# Patient Record
Sex: Female | Born: 1980 | Race: White | Hispanic: No | Marital: Married | Smoking: Former smoker
Health system: Southern US, Community
[De-identification: ages and names within clinical notes are randomized; demographics above are authoritative.]

## PROBLEM LIST (undated history)

## (undated) ENCOUNTER — Inpatient Hospital Stay (HOSPITAL_COMMUNITY): Payer: Self-pay

## (undated) DIAGNOSIS — R87629 Unspecified abnormal cytological findings in specimens from vagina: Secondary | ICD-10-CM

## (undated) DIAGNOSIS — R87619 Unspecified abnormal cytological findings in specimens from cervix uteri: Secondary | ICD-10-CM

## (undated) DIAGNOSIS — Z8619 Personal history of other infectious and parasitic diseases: Secondary | ICD-10-CM

## (undated) DIAGNOSIS — IMO0002 Reserved for concepts with insufficient information to code with codable children: Secondary | ICD-10-CM

## (undated) HISTORY — DX: Personal history of other infectious and parasitic diseases: Z86.19

## (undated) HISTORY — DX: Unspecified abnormal cytological findings in specimens from cervix uteri: R87.619

## (undated) HISTORY — DX: Reserved for concepts with insufficient information to code with codable children: IMO0002

## (undated) HISTORY — DX: Unspecified abnormal cytological findings in specimens from vagina: R87.629

## (undated) HISTORY — PX: COLPOSCOPY: SHX161

## (undated) HISTORY — PX: NO PAST SURGERIES: SHX2092

---
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
## 2001-07-05 ENCOUNTER — Other Ambulatory Visit: Admission: RE | Admit: 2001-07-05 | Discharge: 2001-07-05 | Payer: Self-pay | Admitting: Obstetrics and Gynecology

## 2002-04-28 ENCOUNTER — Other Ambulatory Visit: Admission: RE | Admit: 2002-04-28 | Discharge: 2002-04-28 | Payer: Self-pay | Admitting: Obstetrics and Gynecology

## 2003-05-30 ENCOUNTER — Other Ambulatory Visit: Admission: RE | Admit: 2003-05-30 | Discharge: 2003-05-30 | Payer: Self-pay | Admitting: Obstetrics and Gynecology

## 2004-07-17 ENCOUNTER — Other Ambulatory Visit: Admission: RE | Admit: 2004-07-17 | Discharge: 2004-07-17 | Payer: Self-pay | Admitting: Internal Medicine

## 2004-07-30 ENCOUNTER — Other Ambulatory Visit: Admission: RE | Admit: 2004-07-30 | Discharge: 2004-07-30 | Payer: Self-pay | Admitting: Internal Medicine

## 2005-07-31 ENCOUNTER — Other Ambulatory Visit: Admission: RE | Admit: 2005-07-31 | Discharge: 2005-07-31 | Payer: Self-pay | Admitting: Internal Medicine

## 2006-10-12 ENCOUNTER — Other Ambulatory Visit: Admission: RE | Admit: 2006-10-12 | Discharge: 2006-10-12 | Payer: Self-pay | Admitting: *Deleted

## 2007-10-22 ENCOUNTER — Other Ambulatory Visit: Admission: RE | Admit: 2007-10-22 | Discharge: 2007-10-22 | Payer: Self-pay | Admitting: Family Medicine

## 2010-07-26 ENCOUNTER — Inpatient Hospital Stay (HOSPITAL_COMMUNITY): Admission: AD | Admit: 2010-07-26 | Discharge: 2010-07-29 | Payer: Self-pay | Admitting: Obstetrics and Gynecology

## 2011-03-07 LAB — CBC
HCT: 31.6 % — ABNORMAL LOW (ref 36.0–46.0)
HCT: 39.5 % (ref 36.0–46.0)
MCH: 33 pg (ref 26.0–34.0)
MCHC: 34.4 g/dL (ref 30.0–36.0)
MCHC: 34.8 g/dL (ref 30.0–36.0)
MCV: 97.2 fL (ref 78.0–100.0)
Platelets: 204 10*3/uL (ref 150–400)
RDW: 12.2 % (ref 11.5–15.5)
RDW: 12.5 % (ref 11.5–15.5)
WBC: 16 10*3/uL — ABNORMAL HIGH (ref 4.0–10.5)

## 2011-12-23 NOTE — L&D Delivery Note (Signed)
Pt pushing and resting off and on for 3-4 hours ROP unable to manually rotate Pt request VE.  R&B discussed and informed consent obtained. ROP/+3 station, VE applied and with one pull, Vtx crowned.  VE removed thenSVD viable female Apgars 9,9 over intact perineum.  Placenta delivered spontaneously intact with 3VC. good support and hemostasis noted and R/V exam confirms.  PH art was 74.  [place] cord blood was not done.  Mother and baby were doing well.  EBL 300cc  Candice Camp, MD

## 2012-01-14 LAB — OB RESULTS CONSOLE ABO/RH: RH Type: POSITIVE

## 2012-01-14 LAB — OB RESULTS CONSOLE RUBELLA ANTIBODY, IGM: Rubella: IMMUNE

## 2012-01-14 LAB — OB RESULTS CONSOLE HEPATITIS B SURFACE ANTIGEN: Hepatitis B Surface Ag: NEGATIVE

## 2012-01-14 LAB — OB RESULTS CONSOLE ANTIBODY SCREEN: Antibody Screen: NEGATIVE

## 2012-01-14 LAB — OB RESULTS CONSOLE GC/CHLAMYDIA: Chlamydia: NEGATIVE

## 2012-08-13 ENCOUNTER — Telehealth (HOSPITAL_COMMUNITY): Payer: Self-pay | Admitting: *Deleted

## 2012-08-13 ENCOUNTER — Encounter (HOSPITAL_COMMUNITY): Payer: Self-pay | Admitting: *Deleted

## 2012-08-13 NOTE — Telephone Encounter (Signed)
Preadmission screenPreadmission screen 

## 2012-08-13 NOTE — Telephone Encounter (Signed)
Preadmission screen  

## 2012-08-19 ENCOUNTER — Encounter (HOSPITAL_COMMUNITY): Payer: Self-pay | Admitting: Anesthesiology

## 2012-08-19 ENCOUNTER — Inpatient Hospital Stay (HOSPITAL_COMMUNITY)
Admission: AD | Admit: 2012-08-19 | Discharge: 2012-08-20 | DRG: 775 | Disposition: A | Discharge status: Home / Self Care | Payer: PRIVATE HEALTH INSURANCE | Source: Ambulatory Visit | Attending: Obstetrics and Gynecology | Admitting: Obstetrics and Gynecology

## 2012-08-19 ENCOUNTER — Encounter (HOSPITAL_COMMUNITY): Payer: Self-pay | Admitting: *Deleted

## 2012-08-19 ENCOUNTER — Inpatient Hospital Stay (HOSPITAL_COMMUNITY): Payer: PRIVATE HEALTH INSURANCE | Admitting: Anesthesiology

## 2012-08-19 LAB — CBC
HCT: 38.7 % (ref 36.0–46.0)
MCH: 31 pg (ref 26.0–34.0)
MCHC: 34.6 g/dL (ref 30.0–36.0)
RDW: 12.6 % (ref 11.5–15.5)

## 2012-08-19 LAB — RPR: RPR Ser Ql: NONREACTIVE

## 2012-08-19 MED ORDER — LACTATED RINGERS IV SOLN
500.0000 mL | INTRAVENOUS | Status: DC | PRN
  Administered 2012-08-19: 1000 mL via INTRAVENOUS

## 2012-08-19 MED ORDER — DIPHENHYDRAMINE HCL 50 MG/ML IJ SOLN: 12.5000 mg | INTRAMUSCULAR | Status: DC | PRN

## 2012-08-19 MED ORDER — LACTATED RINGERS IV SOLN: 500.0000 mL | Freq: Once | INTRAVENOUS | Status: DC

## 2012-08-19 MED ORDER — OXYCODONE-ACETAMINOPHEN 5-325 MG PO TABS: 1.0000 | ORAL_TABLET | ORAL | Status: DC | PRN

## 2012-08-19 MED ORDER — MEDROXYPROGESTERONE ACETATE 150 MG/ML IM SUSP: 150.0000 mg | INTRAMUSCULAR | Status: DC | PRN

## 2012-08-19 MED ORDER — ONDANSETRON HCL 4 MG/2ML IJ SOLN: 4.0000 mg | INTRAMUSCULAR | Status: DC | PRN

## 2012-08-19 MED ORDER — FLEET ENEMA 7-19 GM/118ML RE ENEM: 1.0000 | ENEMA | RECTAL | Status: DC | PRN

## 2012-08-19 MED ORDER — LIDOCAINE HCL (PF) 1 % IJ SOLN: 30.0000 mL | INTRAMUSCULAR | Status: DC | PRN

## 2012-08-19 MED ORDER — OXYTOCIN BOLUS FROM INFUSION
250.0000 mL | Freq: Once | INTRAVENOUS | Status: DC
  Filled 2012-08-19: qty 500

## 2012-08-19 MED ORDER — ONDANSETRON HCL 4 MG/2ML IJ SOLN: 4.0000 mg | Freq: Four times a day (QID) | INTRAMUSCULAR | Status: DC | PRN

## 2012-08-19 MED ORDER — ACETAMINOPHEN 325 MG PO TABS: 650.0000 mg | ORAL_TABLET | ORAL | Status: DC | PRN

## 2012-08-19 MED ORDER — CITRIC ACID-SODIUM CITRATE 334-500 MG/5ML PO SOLN: 30.0000 mL | ORAL | Status: DC | PRN

## 2012-08-19 MED ORDER — ONDANSETRON HCL 4 MG PO TABS: 4.0000 mg | ORAL_TABLET | ORAL | Status: DC | PRN

## 2012-08-19 MED ORDER — OXYCODONE-ACETAMINOPHEN 5-325 MG PO TABS
1.0000 | ORAL_TABLET | ORAL | Status: DC | PRN
Start: 2012-08-19 — End: 2012-08-20
  Administered 2012-08-20 (×2): 1 via ORAL
  Filled 2012-08-19 (×2): qty 1

## 2012-08-19 MED ORDER — OXYTOCIN 40 UNITS IN LACTATED RINGERS INFUSION - SIMPLE MED: 62.5000 mL/h | Freq: Once | INTRAVENOUS | Status: DC

## 2012-08-19 MED ORDER — PHENYLEPHRINE 40 MCG/ML (10ML) SYRINGE FOR IV PUSH (FOR BLOOD PRESSURE SUPPORT)
80.0000 ug | PREFILLED_SYRINGE | INTRAVENOUS | Status: DC | PRN
  Filled 2012-08-19: qty 2

## 2012-08-19 MED ORDER — LIDOCAINE HCL (PF) 1 % IJ SOLN
30.0000 mL | INTRAMUSCULAR | Status: DC | PRN
  Filled 2012-08-19: qty 30

## 2012-08-19 MED ORDER — PHENYLEPHRINE 40 MCG/ML (10ML) SYRINGE FOR IV PUSH (FOR BLOOD PRESSURE SUPPORT)
80.0000 ug | PREFILLED_SYRINGE | INTRAVENOUS | Status: DC | PRN
  Filled 2012-08-19: qty 2
  Filled 2012-08-19: qty 5

## 2012-08-19 MED ORDER — IBUPROFEN 600 MG PO TABS: 600.0000 mg | ORAL_TABLET | Freq: Four times a day (QID) | ORAL | Status: DC | PRN

## 2012-08-19 MED ORDER — OXYTOCIN 40 UNITS IN LACTATED RINGERS INFUSION - SIMPLE MED
62.5000 mL/h | Freq: Once | INTRAVENOUS | Status: AC
  Administered 2012-08-19: 62.5 mL/h via INTRAVENOUS
  Filled 2012-08-19: qty 1000

## 2012-08-19 MED ORDER — FENTANYL 2.5 MCG/ML BUPIVACAINE 1/10 % EPIDURAL INFUSION (WH - ANES)
14.0000 mL/h | INTRAMUSCULAR | Status: DC
  Administered 2012-08-19: 14 mL/h via EPIDURAL
  Filled 2012-08-19 (×2): qty 60

## 2012-08-19 MED ORDER — LANOLIN HYDROUS EX OINT: TOPICAL_OINTMENT | CUTANEOUS | Status: DC | PRN

## 2012-08-19 MED ORDER — FENTANYL 2.5 MCG/ML BUPIVACAINE 1/10 % EPIDURAL INFUSION (WH - ANES): 14.0000 mL/h | INTRAMUSCULAR | Status: DC

## 2012-08-19 MED ORDER — DIPHENHYDRAMINE HCL 25 MG PO CAPS: 25.0000 mg | ORAL_CAPSULE | Freq: Four times a day (QID) | ORAL | Status: DC | PRN

## 2012-08-19 MED ORDER — FENTANYL 2.5 MCG/ML BUPIVACAINE 1/10 % EPIDURAL INFUSION (WH - ANES)
INTRAMUSCULAR | Status: DC | PRN
Start: 2012-08-19 — End: 2012-08-19
  Administered 2012-08-19: 14 mL/h via EPIDURAL

## 2012-08-19 MED ORDER — WITCH HAZEL-GLYCERIN EX PADS: 1.0000 "application " | MEDICATED_PAD | CUTANEOUS | Status: DC | PRN

## 2012-08-19 MED ORDER — PHENYLEPHRINE 40 MCG/ML (10ML) SYRINGE FOR IV PUSH (FOR BLOOD PRESSURE SUPPORT): 80.0000 ug | PREFILLED_SYRINGE | INTRAVENOUS | Status: DC | PRN

## 2012-08-19 MED ORDER — MEASLES, MUMPS & RUBELLA VAC ~~LOC~~ INJ: 0.5000 mL | INJECTION | Freq: Once | SUBCUTANEOUS | Status: DC

## 2012-08-19 MED ORDER — EPHEDRINE 5 MG/ML INJ
10.0000 mg | INTRAVENOUS | Status: DC | PRN
  Filled 2012-08-19: qty 2

## 2012-08-19 MED ORDER — DIBUCAINE 1 % RE OINT: 1.0000 "application " | TOPICAL_OINTMENT | RECTAL | Status: DC | PRN

## 2012-08-19 MED ORDER — SENNOSIDES-DOCUSATE SODIUM 8.6-50 MG PO TABS
2.0000 | ORAL_TABLET | Freq: Every day | ORAL | Status: DC
  Administered 2012-08-19: 2 via ORAL

## 2012-08-19 MED ORDER — LACTATED RINGERS IV SOLN
INTRAVENOUS | Status: DC
Start: 2012-08-19 — End: 2012-08-19

## 2012-08-19 MED ORDER — SIMETHICONE 80 MG PO CHEW: 80.0000 mg | CHEWABLE_TABLET | ORAL | Status: DC | PRN

## 2012-08-19 MED ORDER — EPHEDRINE 5 MG/ML INJ: 10.0000 mg | INTRAVENOUS | Status: DC | PRN

## 2012-08-19 MED ORDER — LIDOCAINE HCL (PF) 1 % IJ SOLN
INTRAMUSCULAR | Status: DC | PRN
  Administered 2012-08-19 (×2): 4 mL

## 2012-08-19 MED ORDER — PRENATAL MULTIVITAMIN CH
1.0000 | ORAL_TABLET | Freq: Every day | ORAL | Status: DC
  Administered 2012-08-20: 1 via ORAL
  Filled 2012-08-19: qty 1

## 2012-08-19 MED ORDER — TETANUS-DIPHTH-ACELL PERTUSSIS 5-2.5-18.5 LF-MCG/0.5 IM SUSP: 0.5000 mL | Freq: Once | INTRAMUSCULAR | Status: DC

## 2012-08-19 MED ORDER — IBUPROFEN 600 MG PO TABS
600.0000 mg | ORAL_TABLET | Freq: Four times a day (QID) | ORAL | Status: DC
  Administered 2012-08-19 – 2012-08-20 (×4): 600 mg via ORAL
  Filled 2012-08-19 (×3): qty 1

## 2012-08-19 MED ORDER — EPHEDRINE 5 MG/ML INJ
10.0000 mg | INTRAVENOUS | Status: DC | PRN
  Filled 2012-08-19: qty 4
  Filled 2012-08-19: qty 2

## 2012-08-19 MED ORDER — ZOLPIDEM TARTRATE 5 MG PO TABS: 5.0000 mg | ORAL_TABLET | Freq: Every evening | ORAL | Status: DC | PRN

## 2012-08-19 MED ORDER — BENZOCAINE-MENTHOL 20-0.5 % EX AERO
1.0000 "application " | INHALATION_SPRAY | CUTANEOUS | Status: DC | PRN
  Filled 2012-08-19: qty 56

## 2012-08-19 MED ORDER — OXYTOCIN BOLUS FROM INFUSION
250.0000 mL | Freq: Once | INTRAVENOUS | Status: AC
  Administered 2012-08-19: 250 mL via INTRAVENOUS
  Filled 2012-08-19: qty 500

## 2012-08-19 MED ORDER — LACTATED RINGERS IV SOLN: INTRAVENOUS | Status: DC

## 2012-08-19 NOTE — Anesthesia Postprocedure Evaluation (Signed)
  Anesthesia Post-op Note  Patient: Claudia Ford  Procedure(s) Performed: * No procedures listed *  Patient Location: Mother/Baby  Anesthesia Type: Epidural  Level of Consciousness: awake and alert   Airway and Oxygen Therapy: Patient Spontanous Breathing  Post-op Pain: none  Post-op Assessment: Patient's Cardiovascular Status Stable, Respiratory Function Stable, Patent Airway, No signs of Nausea or vomiting, Adequate PO intake, Pain level controlled, No headache, No backache, No residual numbness and No residual motor weakness  Post-op Vital Signs: Reviewed and stable  Complications: No apparent anesthesia complications

## 2012-08-19 NOTE — Anesthesia Preprocedure Evaluation (Signed)

## 2012-08-19 NOTE — MAU Note (Signed)
Pt states she has been having contractions since 2000 

## 2012-08-19 NOTE — H&P (Signed)
Ladona A Mandelbaum is a 31 y.o. female presenting for ctx and change in cervix. History OB History    Grav Para Term Preterm Abortions TAB SAB Ect Mult Living   2 1 1       1      Past Medical History  Diagnosis Date  . Abnormal Pap smear    Past Surgical History  Procedure Date  . Colposcopy   . No past surgeries    Family History: family history includes Cancer in her father, maternal grandfather, and paternal aunt; Diabetes in her father; Hypertension in her father; Mental retardation in her sister; and Thyroid disease in her mother. Social History:  reports that she has never smoked. She has never used smokeless tobacco. She reports that she does not drink alcohol or use illicit drugs.   Prenatal Transfer Tool  Maternal Diabetes: No Genetic Screening: Declined Maternal Ultrasounds/Referrals: Normal Fetal Ultrasounds or other Referrals:  None Maternal Substance Abuse:  No Significant Maternal Medications:  None Significant Maternal Lab Results:  None Other Comments:  None  ROS  Dilation: Lip/rim Effacement (%): 100 Station: 0 Exam by:: Davis,RN Blood pressure 117/63, pulse 67, temperature 98.1 F (36.7 C), temperature source Oral, resp. rate 18, height 5' 4.6" (1.641 m), weight 74.118 kg (163 lb 6.4 oz), last menstrual period 11/14/2011. Exam Physical Exam  Prenatal labs: ABO, Rh: A/Positive/-- (01/23 0000) Antibody: Negative (01/23 0000) Rubella: Immune (01/23 0000) RPR: Nonreactive (01/23 0000)  HBsAg: Negative (01/23 0000)  HIV: Non-reactive (01/23 0000)  GBS: Negative (07/31 0000)   Assessment/Plan: Exp mngt   Kamaree Berkel 08/19/2012, 6:12 AM

## 2012-08-19 NOTE — Progress Notes (Signed)
Pt pushing x 30 min but RN feeling anterior cervix now.  On my exam, cervix noted from 10-1 oclock, 0 station.  Attempted to reduce w/ next 2 pushes but cervix returns after push.  Pt comfortable w/ epidural.  rec to rest x 30 min and recheck.  if complete, will start pushing.

## 2012-08-19 NOTE — Anesthesia Procedure Notes (Signed)
Epidural Patient location during procedure: OB Start time: 08/19/2012 4:07 AM  Staffing Anesthesiologist: Malen Gauze, Eilish Mcdaniel A. Performed by: anesthesiologist   Preanesthetic Checklist Completed: patient identified, site marked, surgical consent, pre-op evaluation, timeout performed, IV checked, risks and benefits discussed and monitors and equipment checked  Epidural Patient position: sitting Prep: site prepped and draped and DuraPrep Patient monitoring: continuous pulse ox and blood pressure Approach: midline Injection technique: LOR air  Needle:  Needle type: Tuohy  Needle gauge: 17 G Needle length: 9 cm and 9 Needle insertion depth: 7 and 7 cm Catheter type: closed end flexible Catheter size: 19 Gauge Catheter at skin depth: 12 cm Test dose: negative and Other  Assessment Events: blood not aspirated, injection not painful, no injection resistance, negative IV test and no paresthesia  Additional Notes Patient identified. Risks and benefits discussed including failed block, incomplete  Pain control, post dural puncture headache, nerve damage, paralysis, blood pressure Changes, nausea, vomiting, reactions to medications-both toxic and allergic and post Partum back pain. All questions were answered. Patient expressed understanding and wished to proceed. Sterile technique was used throughout procedure. Epidural site was Dressed with sterile barrier dressing. No paresthesias, signs of intravascular injection Or signs of intrathecal spread were encountered.  Patient was more comfortable after the epidural was dosed. Please see RN's note for documentation of vital signs and FHR which are stable.

## 2012-08-19 NOTE — MAU Note (Signed)
Pt G2 P1 at 39.6wks having contractions every since 2000.  Denies any problems with pregnancy.

## 2012-08-20 ENCOUNTER — Inpatient Hospital Stay (HOSPITAL_COMMUNITY): Admission: RE | Admit: 2012-08-20 | Payer: PRIVATE HEALTH INSURANCE | Source: Ambulatory Visit

## 2012-08-20 LAB — CBC
HCT: 35.7 % — ABNORMAL LOW (ref 36.0–46.0)
Platelets: 244 10*3/uL (ref 150–400)
RBC: 3.86 MIL/uL — ABNORMAL LOW (ref 3.87–5.11)
RDW: 13.3 % (ref 11.5–15.5)
WBC: 12.9 10*3/uL — ABNORMAL HIGH (ref 4.0–10.5)

## 2012-08-20 MED ORDER — OXYCODONE-ACETAMINOPHEN 5-325 MG PO TABS: 1.0000 | ORAL_TABLET | ORAL | Status: AC | PRN

## 2012-08-20 MED ORDER — IBUPROFEN 600 MG PO TABS: 600.0000 mg | ORAL_TABLET | Freq: Four times a day (QID) | ORAL | Status: AC

## 2012-08-20 NOTE — Progress Notes (Signed)
UR chart review completed.  

## 2012-08-20 NOTE — Progress Notes (Signed)
Post Partum Day 1 Subjective: no complaints, up ad lib, voiding and tolerating PO  Objective: Blood pressure 105/70, pulse 57, temperature 97.9 F (36.6 C), temperature source Oral, resp. rate 18, height 5' 4.6" (1.641 m), weight 74.118 kg (163 lb 6.4 oz), last menstrual period 11/14/2011, SpO2 97.00%, unknown if currently breastfeeding.  Physical Exam:  General: alert and cooperative Lochia: appropriate Uterine Fundus: firm Incision: perineum intact DVT Evaluation: No evidence of DVT seen on physical exam.   Basename 08/20/12 0515 08/19/12 0325  HGB 12.1 13.4  HCT 35.7* 38.7    Assessment/Plan: Discharge home   LOS: 1 day   Claudia Ford G 08/20/2012, 8:27 AM

## 2012-08-20 NOTE — Discharge Summary (Signed)
Obstetric Discharge Summary Reason for Admission: onset of labor Prenatal Procedures: ultrasound Intrapartum Procedures: vacuum Postpartum Procedures: none Complications-Operative and Postpartum: none Hemoglobin  Date Value Range Status  08/20/2012 12.1  12.0 - 15.0 Ford/dL Final     HCT  Date Value Range Status  08/20/2012 35.7* 36.0 - 46.0 % Final    Physical Exam:  General: alert and cooperative Lochia: appropriate Uterine Fundus: firm Incision: perineum intact DVT Evaluation: No evidence of DVT seen on physical exam.  Discharge Diagnoses: Term Pregnancy-delivered  Discharge Information: Date: 08/20/2012 Activity: pelvic rest Diet: routine Medications: PNV, Ibuprofen and Percocet Condition: stable Instructions: refer to practice specific booklet Discharge to: home   Newborn Data: Live born female  Birth Weight: 7 lb 12 oz (3515 Ford) APGAR: 9, 9  Home with mother.  Claudia Ford 08/20/2012, 8:46 AM

## 2012-08-21 LAB — CORD BLOOD GAS (ARTERIAL)

## 2014-10-23 ENCOUNTER — Encounter (HOSPITAL_COMMUNITY): Payer: Self-pay | Admitting: *Deleted

## 2014-11-21 LAB — OB RESULTS CONSOLE ABO/RH: RH TYPE: POSITIVE

## 2014-11-21 LAB — OB RESULTS CONSOLE GC/CHLAMYDIA
Chlamydia: NEGATIVE
GC PROBE AMP, GENITAL: NEGATIVE

## 2014-11-21 LAB — OB RESULTS CONSOLE RPR: RPR: NONREACTIVE

## 2014-11-21 LAB — OB RESULTS CONSOLE RUBELLA ANTIBODY, IGM: RUBELLA: IMMUNE

## 2014-11-21 LAB — OB RESULTS CONSOLE ANTIBODY SCREEN: Antibody Screen: NEGATIVE

## 2014-11-21 LAB — OB RESULTS CONSOLE HEPATITIS B SURFACE ANTIGEN: Hepatitis B Surface Ag: NEGATIVE

## 2014-11-21 LAB — OB RESULTS CONSOLE HIV ANTIBODY (ROUTINE TESTING): HIV: NONREACTIVE

## 2015-05-30 LAB — OB RESULTS CONSOLE GBS: STREP GROUP B AG: POSITIVE

## 2015-06-21 ENCOUNTER — Telehealth (HOSPITAL_COMMUNITY): Payer: Self-pay | Admitting: *Deleted

## 2015-06-21 ENCOUNTER — Encounter (HOSPITAL_COMMUNITY): Payer: Self-pay | Admitting: *Deleted

## 2015-06-21 NOTE — Telephone Encounter (Signed)
Preadmission screen  

## 2015-06-22 ENCOUNTER — Encounter (HOSPITAL_COMMUNITY): Payer: Self-pay

## 2015-06-22 ENCOUNTER — Observation Stay (HOSPITAL_COMMUNITY)
Admission: RE | Admit: 2015-06-22 | Discharge: 2015-06-22 | Disposition: A | Discharge status: Home / Self Care | Payer: 59 | Source: Ambulatory Visit | Attending: Obstetrics and Gynecology | Admitting: Obstetrics and Gynecology

## 2015-06-22 DIAGNOSIS — O321XX0 Maternal care for breech presentation, not applicable or unspecified: Principal | ICD-10-CM | POA: Insufficient documentation

## 2015-06-22 MED ORDER — TERBUTALINE SULFATE 1 MG/ML IJ SOLN: 0.2500 mg | Freq: Once | INTRAMUSCULAR | Status: DC

## 2015-06-22 NOTE — Progress Notes (Signed)
Bedside Ultrasound - Frank Breech Vertex in RUQ With 1st attempt of forward roll - fetus converted to vertex position Confirmed with ultrasound Will monitor for 1 hour and discharge home after that

## 2015-06-22 NOTE — H&P (Signed)
Joe A Susann GivensFranklin is a 34 y.o. G 3 P 2 at 4138 w 6 days presents for ECV. Baby is frank breech. She was extensively counseled in the office by myself about ECV versus C Section. She elects to have ECV  Maternal Medical History:  Contractions: Frequency: rare.    Prenatal complications: no prenatal complications   OB History    Gravida Para Term Preterm AB TAB SAB Ectopic Multiple Living   3 2 2       2      Past Medical History  Diagnosis Date  . Abnormal Pap smear   . Vaginal Pap smear, abnormal   . Hx of varicella    Past Surgical History  Procedure Laterality Date  . Colposcopy    . No past surgeries     Family History: family history includes Cancer in her father, maternal grandfather, and paternal aunt; Diabetes in her father; Hypertension in her father; Mental retardation in her sister; Thyroid disease in her mother. Social History:  reports that she has never smoked. She has never used smokeless tobacco. She reports that she does not drink alcohol or use illicit drugs.   Prenatal Transfer Tool  Maternal Diabetes: No Genetic Screening: Normal Maternal Ultrasounds/Referrals: Normal Fetal Ultrasounds or other Referrals:  None Maternal Substance Abuse:  No Significant Maternal Medications:  None Significant Maternal Lab Results:  None Other Comments:  None  ROS  Exam by:: Britanie Harshman Height 5\' 4"  (1.626 m), weight 77.111 kg (170 lb), last menstrual period 09/23/2014, unknown if currently breastfeeding. Exam Physical Exam  Prenatal labs: ABO, Rh: A/Positive/-- (12/01 0000) Antibody: Negative (12/01 0000) Rubella: Immune (12/01 0000) RPR: Nonreactive (12/01 0000)  HBsAg: Negative (12/01 0000)  HIV: Non-reactive (12/01 0000)  GBS: Positive (06/08 0000)   Assessment/Plan: IUP at 38 w 6 days Breech Will proceed with ECV Risks reviewed Consent signed   Jahmil Macleod L 06/22/2015, 8:23 AM

## 2015-06-22 NOTE — Progress Notes (Signed)
Monitors dcd. Pt dcd home with husband

## 2015-06-22 NOTE — Discharge Instructions (Signed)
Natural Childbirth °Natural childbirth is going through labor and delivery without any drugs to relieve pain. You also do not use fetal monitors, have a cesarean delivery, or get a sugical cut to enlarge the vaginal opening (episiotomy). With the help of a birthing professional (midwife), you will direct your own labor and delivery as you choose. °Many women chose natural childbirth because they feel more in control and in touch with their labor and delivery. They are also concerned about the medications affecting themselves and the baby. °Pregnant women with a high risk pregnancy should not attempt natural childbirth. It is better to deliver the infant in a hospital if an emergency situation arises. Sometimes, the caregiver has to intervene for the health and safety of the mother and infant. °TWO TECHNIQUES FOR NATURAL CHILDBIRTH:  °· The Lamaze method. This method teaches women that having a baby is normal, healthy, and natural. It also teaches the mother to take a neutral position regarding pain medication and anesthesia and to make an informed decision if and when it is right for them. °· The Bradley method (also called husband coached birth). This method teaches the father to be the birth coach and stresses a natural approach. It also encourages exercise and a balanced diet with good nutrition. The exercises teach relaxation and deep breathing techniques. However, there are also classes to prepare the parents for an emergency situation that may occur. °METHODS OF DEALING WITH LABOR PAIN AND DELIVERY: °· Meditation. °· Yoga. °· Hypnosis. °· Acupuncture. °· Massage. °· Changing positions (walking, rocking, showering, leaning on birth balls). °· Lying in warm water or a jacuzzi. °· Find an activity that keeps your mind off of the labor pain. °· Listen to soft music. °· Visual imagery (focus on a particular object). °BEFORE GOING INTO LABOR °· Be sure you and your spouse/partner are in agreement to have natural  childbirth. °· Decide if your caregiver or a midwife will deliver your baby. °· Decide if you will have your baby in the hospital, birthing center, or at home. °· If you have children, make plans to have someone to take care of them when you go to the hospital. °· Know the distance and the time it takes to go to the delivery center. Make a dry run to be sure. °· Have a bag packed with a night gown, bathrobe, and toiletries ready to take when you go into labor. °· Keep phone numbers of your family and friends handy if you need to call someone when you go into labor. °· Your spouse or partner should go to all the teaching classes. °· Talk with your caregiver about the possibility of a medical emergency and what will happen if that occurs. °ADVANTAGES OF NATURAL CHILDBIRTH °· You are in control of your labor and delivery. °· It is safe. °· There are no medications or anesthetics that may affect you and the fetus. °· There are no invasive procedures such as an episiotomy. °· You and your partner will work together, which can increase your bond. °· Meditation, yoga, massage, and breathing exercises can be learned while pregnant and help you when you are in labor and at delivery. °· In most delivery centers, the family and friends can be involved in the labor and delivery process. °DISADVANTAGES OF NATURAL CHILDBIRTH °· You will experience pain during your labor and delivery. °· The methods of helping relieve your labor pains may not work for you. °· You may feel embarrassed, disappointed, and like a failure   if you decide to change your mind during labor and not have natural childbirth. AFTER THE DELIVERY  You will be very tired.  You will be uncomfortable because of your uterus contracting. You will feel soreness around the vagina.  You may feel cold and shaky.This is a natural reaction.  You will be excited, overwhelmed, accomplished, and proud to be a mother. HOME CARE INSTRUCTIONS   Follow the advice and  instructions of your caregiver.  Follow the instructions of your natural childbirth instructor (Lamaze or Bradley Method). Document Released: 11/20/2008 Document Revised: 03/01/2012 Document Reviewed: 08/15/2013 [facility] Patient Information 2015 [place], [place]. This information is not intended to replace advice given to you by your health care provider. Make sure you discuss any questions you have with your health care provider. Fetal Movement Counts Patient Name: __________________________________________________ Patient Due Date: ____________________ Performing a fetal movement count is highly recommended in high-risk pregnancies, but it is good for every pregnant woman to do. Your health care provider may ask you to start counting fetal movements at 28 weeks of the pregnancy. Fetal movements often increase:  After eating a full meal.  After physical activity.  After eating or drinking something sweet or cold.  At rest. Pay attention to when you feel the baby is most active. This will help you notice a pattern of your baby's sleep and wake cycles and what factors contribute to an increase in fetal movement. It is important to perform a fetal movement count at the same time each day when your baby is normally most active.  HOW TO COUNT FETAL MOVEMENTS  Find a quiet and comfortable area to sit or lie down on your left side. Lying on your left side provides the best blood and oxygen circulation to your baby.  Write down the day and time on a sheet of paper or in a journal.  Start counting kicks, flutters, swishes, rolls, or jabs in a 2-hour period. You should feel at least 10 movements within 2 hours.  If you do not feel 10 movements in 2 hours, wait 2-3 hours and count again. Look for a change in the pattern or not enough counts in 2 hours. SEEK MEDICAL CARE IF:  You feel less than 10 counts in 2 hours, tried twice.  There is no movement in over an hour.  The pattern is changing or  taking longer each day to reach 10 counts in 2 hours.  You feel the baby is not moving as he or she usually does. Date: ____________ Movements: ____________ Start time: ____________ Doreatha Martin time: ____________  Date: ____________ Movements: ____________ Start time: ____________ Doreatha Martin time: ____________ Date: ____________ Movements: ____________ Start time: ____________ Doreatha Martin time: ____________ Date: ____________ Movements: ____________ Start time: ____________ Doreatha Martin time: ____________ Date: ____________ Movements: ____________ Start time: ____________ Doreatha Martin time: ____________ Date: ____________ Movements: ____________ Start time: ____________ Doreatha Martin time: ____________ Date: ____________ Movements: ____________ Start time: ____________ Doreatha Martin time: ____________ Date: ____________ Movements: ____________ Start time: ____________ Doreatha Martin time: ____________  Date: ____________ Movements: ____________ Start time: ____________ Doreatha Martin time: ____________ Date: ____________ Movements: ____________ Start time: ____________ Doreatha Martin time: ____________ Date: ____________ Movements: ____________ Start time: ____________ Doreatha Martin time: ____________ Date: ____________ Movements: ____________ Start time: ____________ Doreatha Martin time: ____________ Date: ____________ Movements: ____________ Start time: ____________ Doreatha Martin time: ____________ Date: ____________ Movements: ____________ Start time: ____________ Doreatha Martin time: ____________ Date: ____________ Movements: ____________ Start time: ____________ Doreatha Martin time: ____________  Date: ____________ Movements: ____________ Start time: ____________ Doreatha Martin time: ____________ Date: ____________ Movements: ____________ Start time:  ____________ Doreatha Martin time: ____________ Date: ____________ Movements: ____________ Start time: ____________ Doreatha Martin time: ____________ Date: ____________ Movements: ____________ Start time: ____________ Doreatha Martin time: ____________ Date: ____________  Movements: ____________ Start time: ____________ Doreatha Martin time: ____________ Date: ____________ Movements: ____________ Start time: ____________ Doreatha Martin time: ____________ Date: ____________ Movements: ____________ Start time: ____________ Doreatha Martin time: ____________  Date: ____________ Movements: ____________ Start time: ____________ Doreatha Martin time: ____________ Date: ____________ Movements: ____________ Start time: ____________ Doreatha Martin time: ____________ Date: ____________ Movements: ____________ Start time: ____________ Doreatha Martin time: ____________ Date: ____________ Movements: ____________ Start time: ____________ Doreatha Martin time: ____________ Date: ____________ Movements: ____________ Start time: ____________ Doreatha Martin time: ____________ Date: ____________ Movements: ____________ Start time: ____________ Doreatha Martin time: ____________ Date: ____________ Movements: ____________ Start time: ____________ Doreatha Martin time: ____________  Date: ____________ Movements: ____________ Start time: ____________ Doreatha Martin time: ____________ Date: ____________ Movements: ____________ Start time: ____________ Doreatha Martin time: ____________ Date: ____________ Movements: ____________ Start time: ____________ Doreatha Martin time: ____________ Date: ____________ Movements: ____________ Start time: ____________ Doreatha Martin time: ____________ Date: ____________ Movements: ____________ Start time: ____________ Doreatha Martin time: ____________ Date: ____________ Movements: ____________ Start time: ____________ Doreatha Martin time: ____________ Date: ____________ Movements: ____________ Start time: ____________ Doreatha Martin time: ____________  Date: ____________ Movements: ____________ Start time: ____________ Doreatha Martin time: ____________ Date: ____________ Movements: ____________ Start time: ____________ Doreatha Martin time: ____________ Date: ____________ Movements: ____________ Start time: ____________ Doreatha Martin time: ____________ Date: ____________ Movements: ____________ Start time:  ____________ Doreatha Martin time: ____________ Date: ____________ Movements: ____________ Start time: ____________ Doreatha Martin time: ____________ Date: ____________ Movements: ____________ Start time: ____________ Doreatha Martin time: ____________ Date: ____________ Movements: ____________ Start time: ____________ Doreatha Martin time: ____________  Date: ____________ Movements: ____________ Start time: ____________ Doreatha Martin time: ____________ Date: ____________ Movements: ____________ Start time: ____________ Doreatha Martin time: ____________ Date: ____________ Movements: ____________ Start time: ____________ Doreatha Martin time: ____________ Date: ____________ Movements: ____________ Start time: ____________ Doreatha Martin time: ____________ Date: ____________ Movements: ____________ Start time: ____________ Doreatha Martin time: ____________ Date: ____________ Movements: ____________ Start time: ____________ Doreatha Martin time: ____________ Date: ____________ Movements: ____________ Start time: ____________ Doreatha Martin time: ____________  Date: ____________ Movements: ____________ Start time: ____________ Doreatha Martin time: ____________ Date: ____________ Movements: ____________ Start time: ____________ Doreatha Martin time: ____________ Date: ____________ Movements: ____________ Start time: ____________ Doreatha Martin time: ____________ Date: ____________ Movements: ____________ Start time: ____________ Doreatha Martin time: ____________ Date: ____________ Movements: ____________ Start time: ____________ Doreatha Martin time: ____________ Date: ____________ Movements: ____________ Start time: ____________ Doreatha Martin time: ____________ Document Released: 01/07/2007 Document Revised: 04/24/2014 Document Reviewed: 10/04/2012 [employer] Patient Information 2015 [place]. This information is not intended to replace advice given to you by your health care provider. Make sure you discuss any questions you have with your health care provider. External Cephalic Version External cephalic version is turning a baby  that is presenting his or her buttocks first (breech) or is lying sideways in the uterus (transverse) to a head-first position. This makes the labor and delivery faster, safer for the mother and baby, and lessens the chance for a cesarean section. It should not be tried until the pregnancy is [redacted] weeks along or longer. BEFORE THE PROCEDURE   Do not take aspirin.  Do not eat for 4 hours before the procedure.  Tell your caregiver if you have a cold, fever, or an infection.  Tell your caregiver if you are having contractions.  Tell your caregiver if you are leaking or had a gush of fluid from your vagina.  Tell your caregiver if you have any vaginal bleeding or abnormal discharge.  If you are being admitted the same day, arrive at the hospital at least one hour  before the procedure to sign any necessary documents and to get prepared for the procedure.  Tell your caregiver if you had any problems with anesthetics in the past.  Tell your caregiver if you are taking any medications that your caregiver does not know about. This includes over-the-counter and prescription drugs, herbs, eye drops and creams. PROCEDURE  First, an ultrasound is done to make sure the baby is breech or transverse.  A non-stress test or biophysical profile is done on the baby before the ECV. This is done to make sure it is safe for the baby to have the ECV. It may also be done after the procedure to make sure the baby is okay.  ECV is done in the delivery/surgical room with an anesthesiologist present. There should be a setup for an emergency cesarean section with a full nursing and nursery staff available and ready.  The patient may be given a medication to relax the uterine muscles. An epidural may be given for any discomfort. It is helpful for the success of the ECV.  An electronic fetal monitor is placed on the uterus during the procedure to make sure the baby is okay.  If the mother is Rh-negative, Rho (D)  immune globulin will be given to her to prevent Rh problems for future pregnancies.  The mother is followed closely for 2 to 3 hours after the procedure to make sure no problems develop. BENEFITS OF ECV  Easier and safer labor and delivery for the mother and baby.  Lower incidence of cesarean section.  Lower costs with a vaginal delivery. RISKS OF ECV  The placenta pulls away from the wall of the uterus before delivery (abruption of the placenta).  Rupture of the uterus, especially in patients with a previous cesarean section.  Fetal distress.  Early (premature) labor.  Premature rupture of the membranes.  The baby will return to the breech or transverse lie position.  Death of the fetus can happen but is very rare. ECV SHOULD BE STOPPED IF:  The fetal heart tones drop.  The mother is having a lot of pain.  You cannot turn the baby after several attempts. ECV SHOULD NOT BE DONE IF:  The non-stress test or biophysical profile is abnormal.  There is vaginal bleeding.  An abnormal shaped uterus is present.  There is heart disease or uncontrolled high blood pressure in the mother.  There are twins or more.  The placenta covers the opening of the cervix (placenta previa).  You had a previous cesarean section with a classical incision or major surgery of the uterus.  There is not enough amniotic fluid in the sac (oligohydramnios).  The baby is too small for the pregnancy or has not developed normally (anomaly).  Your membranes have ruptured. HOME CARE INSTRUCTIONS   Have someone take you home after the procedure.  Rest at home for several hours.  Have someone stay with you for a few hours after you get home.  After ECV, continue with your prenatal visits as directed.  Continue your regular diet, rest and activities.  Do not do any strenuous activities for a couple of days. SEEK IMMEDIATE MEDICAL CARE IF:   You develop vaginal bleeding.  You have fluid  coming out of your vagina (bag of water may have broken).  You develop uterine contractions.  You do not feel the baby move or there is less movement of the baby.  You develop abdominal pain.  You develop an oral temperature of 102  F (38.9 C) or higher. Document Released: 06/02/2007 Document Revised: 04/24/2014 Document Reviewed: 03/28/2009 [facility] Patient Information 2015 [place]. This information is not intended to replace advice given to you by your health care provider. Make sure you discuss any questions you have with your health care provider.

## 2015-06-22 NOTE — Discharge Summary (Signed)
  ADMISSION DIAGNOSIS: IUP at 38 w 6 days Breech Presentation  DISCHARGE DIAGNOSIS: IUP at 38 w 6 days Successful version  Hospital Course: 34 year old G 3 P 2 at 4638 w 6 days underwent successful version.  After 1 hour of monitoring she was discharged home. Follow up in office on July 7

## 2015-06-28 ENCOUNTER — Telehealth (HOSPITAL_COMMUNITY): Payer: Self-pay | Admitting: *Deleted

## 2015-06-28 NOTE — Telephone Encounter (Signed)
Preadmission screen  

## 2015-07-04 ENCOUNTER — Inpatient Hospital Stay (HOSPITAL_COMMUNITY)
Admission: RE | Admit: 2015-07-04 | Discharge: 2015-07-06 | DRG: 775 | Disposition: A | Discharge status: Home / Self Care | Payer: 59 | Source: Ambulatory Visit | Attending: Obstetrics and Gynecology | Admitting: Obstetrics and Gynecology

## 2015-07-04 ENCOUNTER — Inpatient Hospital Stay (HOSPITAL_COMMUNITY): Payer: 59 | Admitting: Anesthesiology

## 2015-07-04 ENCOUNTER — Encounter (HOSPITAL_COMMUNITY): Payer: Self-pay

## 2015-07-04 DIAGNOSIS — O99824 Streptococcus B carrier state complicating childbirth: Secondary | ICD-10-CM | POA: Diagnosis present

## 2015-07-04 DIAGNOSIS — Z3A40 40 weeks gestation of pregnancy: Secondary | ICD-10-CM | POA: Diagnosis present

## 2015-07-04 DIAGNOSIS — O480 Post-term pregnancy: Secondary | ICD-10-CM | POA: Diagnosis present

## 2015-07-04 DIAGNOSIS — Z87891 Personal history of nicotine dependence: Secondary | ICD-10-CM | POA: Diagnosis not present

## 2015-07-04 LAB — TYPE AND SCREEN
ABO/RH(D): A POS
Antibody Screen: NEGATIVE

## 2015-07-04 LAB — ABO/RH: ABO/RH(D): A POS

## 2015-07-04 LAB — CBC
HCT: 33.8 % — ABNORMAL LOW (ref 36.0–46.0)
Hemoglobin: 11.4 g/dL — ABNORMAL LOW (ref 12.0–15.0)
MCH: 29.9 pg (ref 26.0–34.0)
MCHC: 33.7 g/dL (ref 30.0–36.0)
MCV: 88.7 fL (ref 78.0–100.0)
Platelets: 282 10*3/uL (ref 150–400)
RBC: 3.81 MIL/uL — AB (ref 3.87–5.11)
RDW: 13.2 % (ref 11.5–15.5)
WBC: 9.6 10*3/uL (ref 4.0–10.5)

## 2015-07-04 LAB — RPR: RPR Ser Ql: NONREACTIVE

## 2015-07-04 MED ORDER — OXYCODONE-ACETAMINOPHEN 5-325 MG PO TABS: 2.0000 | ORAL_TABLET | ORAL | Status: DC | PRN

## 2015-07-04 MED ORDER — PHENYLEPHRINE 40 MCG/ML (10ML) SYRINGE FOR IV PUSH (FOR BLOOD PRESSURE SUPPORT): 80.0000 ug | PREFILLED_SYRINGE | INTRAVENOUS | Status: DC | PRN

## 2015-07-04 MED ORDER — ACETAMINOPHEN 325 MG PO TABS: 650.0000 mg | ORAL_TABLET | ORAL | Status: DC | PRN

## 2015-07-04 MED ORDER — ONDANSETRON HCL 4 MG PO TABS: 4.0000 mg | ORAL_TABLET | ORAL | Status: DC | PRN

## 2015-07-04 MED ORDER — ONDANSETRON HCL 4 MG/2ML IJ SOLN: 4.0000 mg | INTRAMUSCULAR | Status: DC | PRN

## 2015-07-04 MED ORDER — BISACODYL 10 MG RE SUPP: 10.0000 mg | Freq: Every day | RECTAL | Status: DC | PRN

## 2015-07-04 MED ORDER — SIMETHICONE 80 MG PO CHEW: 80.0000 mg | CHEWABLE_TABLET | ORAL | Status: DC | PRN

## 2015-07-04 MED ORDER — WITCH HAZEL-GLYCERIN EX PADS: 1.0000 "application " | MEDICATED_PAD | CUTANEOUS | Status: DC | PRN

## 2015-07-04 MED ORDER — DIBUCAINE 1 % RE OINT: 1.0000 "application " | TOPICAL_OINTMENT | RECTAL | Status: DC | PRN

## 2015-07-04 MED ORDER — OXYTOCIN BOLUS FROM INFUSION: 500.0000 mL | INTRAVENOUS | Status: DC

## 2015-07-04 MED ORDER — PRENATAL MULTIVITAMIN CH
1.0000 | ORAL_TABLET | Freq: Every day | ORAL | Status: DC
  Administered 2015-07-05: 1 via ORAL
  Filled 2015-07-04: qty 1

## 2015-07-04 MED ORDER — DIPHENHYDRAMINE HCL 50 MG/ML IJ SOLN: 12.5000 mg | INTRAMUSCULAR | Status: DC | PRN

## 2015-07-04 MED ORDER — OXYCODONE-ACETAMINOPHEN 5-325 MG PO TABS: 1.0000 | ORAL_TABLET | ORAL | Status: DC | PRN

## 2015-07-04 MED ORDER — OXYTOCIN 40 UNITS IN LACTATED RINGERS INFUSION - SIMPLE MED
1.0000 m[IU]/min | INTRAVENOUS | Status: DC
  Administered 2015-07-04: 2 m[IU]/min via INTRAVENOUS
  Filled 2015-07-04: qty 1000

## 2015-07-04 MED ORDER — SENNOSIDES-DOCUSATE SODIUM 8.6-50 MG PO TABS
2.0000 | ORAL_TABLET | ORAL | Status: DC
  Administered 2015-07-05 (×2): 2 via ORAL
  Filled 2015-07-04 (×2): qty 2

## 2015-07-04 MED ORDER — FLEET ENEMA 7-19 GM/118ML RE ENEM
1.0000 | ENEMA | Freq: Every day | RECTAL | Status: DC | PRN
Start: 2015-07-04 — End: 2015-07-06

## 2015-07-04 MED ORDER — BENZOCAINE-MENTHOL 20-0.5 % EX AERO: 1.0000 "application " | INHALATION_SPRAY | CUTANEOUS | Status: DC | PRN

## 2015-07-04 MED ORDER — CITRIC ACID-SODIUM CITRATE 334-500 MG/5ML PO SOLN
30.0000 mL | ORAL | Status: DC | PRN
Start: 2015-07-04 — End: 2015-07-04

## 2015-07-04 MED ORDER — FENTANYL 2.5 MCG/ML BUPIVACAINE 1/10 % EPIDURAL INFUSION (WH - ANES)
14.0000 mL/h | INTRAMUSCULAR | Status: DC | PRN
Start: 2015-07-04 — End: 2015-07-04
  Administered 2015-07-04 (×2): 14 mL/h via EPIDURAL

## 2015-07-04 MED ORDER — CLINDAMYCIN PHOSPHATE 900 MG/50ML IV SOLN
900.0000 mg | Freq: Three times a day (TID) | INTRAVENOUS | Status: DC
  Administered 2015-07-04 (×2): 900 mg via INTRAVENOUS
  Filled 2015-07-04 (×5): qty 50

## 2015-07-04 MED ORDER — CITRIC ACID-SODIUM CITRATE 334-500 MG/5ML PO SOLN: 30.0000 mL | ORAL | Status: DC | PRN

## 2015-07-04 MED ORDER — BUTORPHANOL TARTRATE 1 MG/ML IJ SOLN: 1.0000 mg | INTRAMUSCULAR | Status: DC | PRN

## 2015-07-04 MED ORDER — LACTATED RINGERS IV SOLN: 500.0000 mL | INTRAVENOUS | Status: DC | PRN

## 2015-07-04 MED ORDER — ZOLPIDEM TARTRATE 5 MG PO TABS: 5.0000 mg | ORAL_TABLET | Freq: Every evening | ORAL | Status: DC | PRN

## 2015-07-04 MED ORDER — EPHEDRINE 5 MG/ML INJ
10.0000 mg | INTRAVENOUS | Status: DC | PRN
Start: 2015-07-04 — End: 2015-07-04
  Filled 2015-07-04: qty 2

## 2015-07-04 MED ORDER — TETANUS-DIPHTH-ACELL PERTUSSIS 5-2.5-18.5 LF-MCG/0.5 IM SUSP: 0.5000 mL | Freq: Once | INTRAMUSCULAR | Status: DC

## 2015-07-04 MED ORDER — DIPHENHYDRAMINE HCL 25 MG PO CAPS: 25.0000 mg | ORAL_CAPSULE | Freq: Four times a day (QID) | ORAL | Status: DC | PRN

## 2015-07-04 MED ORDER — LANOLIN HYDROUS EX OINT: TOPICAL_OINTMENT | CUTANEOUS | Status: DC | PRN

## 2015-07-04 MED ORDER — OXYCODONE-ACETAMINOPHEN 5-325 MG PO TABS
1.0000 | ORAL_TABLET | ORAL | Status: DC | PRN
  Administered 2015-07-05 (×3): 1 via ORAL
  Filled 2015-07-04 (×3): qty 1

## 2015-07-04 MED ORDER — LIDOCAINE HCL (PF) 1 % IJ SOLN
INTRAMUSCULAR | Status: DC | PRN
  Administered 2015-07-04 (×2): 4 mL via EPIDURAL

## 2015-07-04 MED ORDER — IBUPROFEN 600 MG PO TABS
600.0000 mg | ORAL_TABLET | Freq: Four times a day (QID) | ORAL | Status: DC
  Administered 2015-07-04 – 2015-07-06 (×7): 600 mg via ORAL
  Filled 2015-07-04 (×7): qty 1

## 2015-07-04 MED ORDER — LIDOCAINE HCL (PF) 1 % IJ SOLN
30.0000 mL | INTRAMUSCULAR | Status: DC | PRN
  Filled 2015-07-04: qty 30

## 2015-07-04 MED ORDER — LACTATED RINGERS IV SOLN
INTRAVENOUS | Status: DC
  Administered 2015-07-04: 08:00:00 via INTRAVENOUS

## 2015-07-04 MED ORDER — TERBUTALINE SULFATE 1 MG/ML IJ SOLN
0.2500 mg | Freq: Once | INTRAMUSCULAR | Status: DC | PRN
  Filled 2015-07-04: qty 1

## 2015-07-04 MED ORDER — OXYTOCIN 40 UNITS IN LACTATED RINGERS INFUSION - SIMPLE MED: 62.5000 mL/h | INTRAVENOUS | Status: DC

## 2015-07-04 MED ORDER — FLEET ENEMA 7-19 GM/118ML RE ENEM: 1.0000 | ENEMA | RECTAL | Status: DC | PRN

## 2015-07-04 MED ORDER — FENTANYL 2.5 MCG/ML BUPIVACAINE 1/10 % EPIDURAL INFUSION (WH - ANES)
14.0000 mL/h | INTRAMUSCULAR | Status: DC | PRN
Start: 2015-07-04 — End: 2015-07-04
  Filled 2015-07-04: qty 125

## 2015-07-04 MED ORDER — LACTATED RINGERS IV SOLN
INTRAVENOUS | Status: DC
  Administered 2015-07-04: 300 mL via INTRAUTERINE

## 2015-07-04 MED ORDER — LACTATED RINGERS IV SOLN
500.0000 mL | INTRAVENOUS | Status: DC | PRN
Start: 2015-07-04 — End: 2015-07-04
  Administered 2015-07-04 (×2): 500 mL via INTRAVENOUS

## 2015-07-04 MED ORDER — ONDANSETRON HCL 4 MG/2ML IJ SOLN: 4.0000 mg | Freq: Four times a day (QID) | INTRAMUSCULAR | Status: DC | PRN

## 2015-07-04 MED ORDER — PHENYLEPHRINE 40 MCG/ML (10ML) SYRINGE FOR IV PUSH (FOR BLOOD PRESSURE SUPPORT)
80.0000 ug | PREFILLED_SYRINGE | INTRAVENOUS | Status: AC | PRN
  Administered 2015-07-04 (×3): 80 ug via INTRAVENOUS
  Filled 2015-07-04: qty 20

## 2015-07-04 MED ORDER — LACTATED RINGERS IV SOLN
INTRAVENOUS | Status: DC
  Administered 2015-07-04: 01:00:00 via INTRAVENOUS

## 2015-07-04 MED ORDER — OXYTOCIN 40 UNITS IN LACTATED RINGERS INFUSION - SIMPLE MED
62.5000 mL/h | INTRAVENOUS | Status: DC
  Administered 2015-07-04: 999 mL/h via INTRAVENOUS

## 2015-07-04 NOTE — Anesthesia Procedure Notes (Signed)
Epidural Patient location during procedure: OB Start time: 07/04/2015 10:12 AM  Staffing Anesthesiologist: Mal AmabileFOSTER, [place] Goeken Performed by: anesthesiologist   Preanesthetic Checklist Completed: patient identified, site marked, surgical consent, pre-op evaluation, timeout performed, IV checked, risks and benefits discussed and monitors and equipment checked  Epidural Patient position: sitting Prep: site prepped and draped and DuraPrep Patient monitoring: continuous pulse ox and blood pressure Approach: midline Location: L3-L4 Injection technique: LOR air  Needle:  Needle type: Tuohy  Needle gauge: 17 G Needle length: 9 cm and 9 Needle insertion depth: 5 cm cm Catheter type: closed end flexible Catheter size: 19 Gauge Catheter at skin depth: 10 cm Test dose: negative and Other  Assessment Events: blood not aspirated, injection not painful, no injection resistance, negative IV test and no paresthesia  Additional Notes Patient identified. Risks and benefits discussed including failed block, incomplete  Pain control, post dural puncture headache, nerve damage, paralysis, blood pressure Changes, nausea, vomiting, reactions to medications-both toxic and allergic and post Partum back pain. All questions were answered. Patient expressed understanding and wished to proceed. Sterile technique was used throughout procedure. Epidural site was Dressed with sterile barrier dressing. No paresthesias, signs of intravascular injection Or signs of intrathecal spread were encountered.  Patient was more comfortable after the epidural was dosed. Please see RN's note for documentation of vital signs and FHR which are stable.

## 2015-07-04 NOTE — Anesthesia Preprocedure Evaluation (Signed)
Anesthesia Evaluation  Patient identified by MRN, date of birth, ID band Patient awake    Reviewed: Allergy & Precautions, Patient's Chart, lab work & pertinent test results  Airway Mallampati: II  TM Distance: >3 FB Neck ROM: Full    Dental no notable dental hx. (+) Teeth Intact   Pulmonary former smoker,  breath sounds clear to auscultation  Pulmonary exam normal       Cardiovascular negative cardio ROS Normal cardiovascular examRhythm:Regular Rate:Normal     Neuro/Psych negative neurological ROS  negative psych ROS   GI/Hepatic Neg liver ROS, GERD-  ,  Endo/Other  negative endocrine ROS  Renal/GU negative Renal ROS  negative genitourinary   Musculoskeletal   Abdominal   Peds  Hematology  (+) anemia ,   Anesthesia Other Findings   Reproductive/Obstetrics (+) Pregnancy                             Anesthesia Physical Anesthesia Plan  ASA: II  Anesthesia Plan: Epidural   Post-op Pain Management:    Induction:   Airway Management Planned: Natural Airway  Additional Equipment:   Intra-op Plan:   Post-operative Plan:   Informed Consent: I have reviewed the patients History and Physical, chart, labs and discussed the procedure including the risks, benefits and alternatives for the proposed anesthesia with the patient or authorized representative who has indicated his/her understanding and acceptance.     Plan Discussed with: Anesthesiologist  Anesthesia Plan Comments:         Anesthesia Quick Evaluation

## 2015-07-04 NOTE — Progress Notes (Signed)
Operative Delivery Note At 2:26 PM a viable female was delivered via Vaginal, Spontaneous Delivery.  Presentation: vertex; Position: Occiput,, Anterior; Station: +3.  Verbal consent: obtained from patient.  Risks and benefits discussed in detail.  Risks include, but are not limited to the risks of anesthesia, bleeding, infection, damage to maternal tissues, fetal cephalhematoma.  There is also the risk of inability to effect vaginal delivery of the head, or shoulder dystocia that cannot be resolved by established maneuvers, leading to the need for emergency cesarean section.  Terminal bradycardia 80-90s over about 3 UCs at +3 with slow progress. VE done to minimize bradycardia. Gentle 2 finger pull. Shoulder dystocia-McRoberts, suprapubic pressure, gentle traction-about 60-90 sec. Baby vigourous.  APGAR:7 ,9 ; weight  .   Placenta status:intact , .   Cord:3 vessels  with the following complications: .  Cord pH: pending  Anesthesia:  epidural Instruments: Kiwi Episiotomy:   Lacerations:  none Suture Repair:  Est. Blood Loss (mL):    Mom to postpartum.  Baby to Couplet care / Skin to Skin.  Harlem Thresher II,Keliyah Spillman E 07/04/2015, 2:36 PM

## 2015-07-04 NOTE — H&P (Signed)
Claudia Ford is a 34 y.o. female presenting for post IOL. Had successful ECV 1-2 weeks ago. Exam in office yesterday noted vertex baby. Maternal Medical History:  Fetal activity: Perceived fetal activity is normal.      OB History    Gravida Para Term Preterm AB TAB SAB Ectopic Multiple Living   3 2 2       2      Past Medical History  Diagnosis Date  . Abnormal Pap smear   . Vaginal Pap smear, abnormal   . Hx of varicella    Past Surgical History  Procedure Laterality Date  . Colposcopy    . No past surgeries     Family History: family history includes Cancer in her father, maternal grandfather, and paternal aunt; Diabetes in her father; Hypertension in her father; Mental retardation in her sister; Thyroid disease in her mother. Social History:  reports that she quit smoking about 5 years ago. Her smoking use included Cigarettes. She has never used smokeless tobacco. She reports that she does not drink alcohol or use illicit drugs.   Prenatal Transfer Tool  Maternal Diabetes: No Genetic Screening: Normal Maternal Ultrasounds/Referrals: Normal Fetal Ultrasounds or other Referrals:  None Maternal Substance Abuse:  No Significant Maternal Medications:  None Significant Maternal Lab Results:  None Other Comments:  None  Review of Systems  Eyes: Negative for blurred vision.  Gastrointestinal: Negative for abdominal pain.  Neurological: Negative for headaches.    Dilation: 2.5 Effacement (%): 50 Station: -3 Exam by:: Jedediah Noda MD Blood pressure 116/67, pulse 67, temperature 97.5 F (36.4 C), temperature source Oral, resp. rate 16, height 5\' 4"  (1.626 m), weight 171 lb (77.565 kg), last menstrual period 09/23/2014, unknown if currently breastfeeding. Maternal Exam:  Uterine Assessment: Contraction strength is mild.  Contraction frequency is irregular.   Abdomen: Patient reports no abdominal tenderness. Fetal presentation: vertex     Fetal Exam Fetal State  Assessment: Category I - tracings are normal.     Physical Exam  Cardiovascular: Normal rate and regular rhythm.   Respiratory: Effort normal and breath sounds normal.  GI: Soft. Bowel sounds are normal.  Neurological: She has normal reflexes.    Cx 2-3/50/-3/vtx  Prenatal labs: ABO, Rh: --/--/A POS (07/13 0038) Antibody: NEG (07/13 0038) Rubella: Immune (12/01 0000) RPR: Nonreactive (12/01 0000)  HBsAg: Negative (12/01 0000)  HIV: Non-reactive (12/01 0000)  GBS: Positive (06/08 0000)   Assessment/Plan: 34 yo G3P2 @ 40 4/7 weeks  D/W patient induction and risks All questions answered, patient states she understands and agrees  Liyla Radliff II,Rokhaya Quinn E 07/04/2015, 7:24 AM

## 2015-07-04 NOTE — Plan of Care (Signed)
Problem: Consults Goal: Birthing Suites Patient Information Press F2 to bring up selections list Outcome: Completed/Met Date Met:  07/04/15  Pt > [redacted] weeks EGA and Inpatient induction

## 2015-07-05 LAB — CBC
HCT: 31.5 % — ABNORMAL LOW (ref 36.0–46.0)
Hemoglobin: 10.5 g/dL — ABNORMAL LOW (ref 12.0–15.0)
MCH: 29.9 pg (ref 26.0–34.0)
MCHC: 33.3 g/dL (ref 30.0–36.0)
MCV: 89.7 fL (ref 78.0–100.0)
Platelets: 244 10*3/uL (ref 150–400)
RBC: 3.51 MIL/uL — ABNORMAL LOW (ref 3.87–5.11)
RDW: 13.4 % (ref 11.5–15.5)
WBC: 11.7 10*3/uL — AB (ref 4.0–10.5)

## 2015-07-05 LAB — HIV ANTIBODY (ROUTINE TESTING W REFLEX): HIV SCREEN 4TH GENERATION: NONREACTIVE

## 2015-07-05 NOTE — Lactation Note (Signed)
This note was copied from the chart of Claudia Kevin FentonMelissa Carbary. Lactation Consultation Note Mom attempted to BF her older 2 children but stated they wouldn't latch. Mom has good everted nipples, compressible areolas, hand expressed colostrum easily. Baby fussy but would suckle on breast. Rooting and cueing to BF but will not latch. Screams and cries. Attempted to burp and put back to breast. Baby still wouldn't suckle on breast.  W/gloved finger assessed suck, baby chomps w/o suckling. Humps tongue up in back, will not put tongue under finger, suck training w/tongue massage to try to get baby to suck. Baby very frustrated chomping. Encouraged mom to hand express and give colostrum and stimulate baby to suck. Note baby has slight heart shaped tongue w/upper lip labial frenulum.  Mom encouraged to feed baby 8-12 times/24 hours and with feeding cues. Mom encouraged to waken baby for feeds. Educated about newborn behavior, milk transfer, STS, supply and demand, cluster feeding, and I&O. Mom reports + breast changes w/pregnancy. Referred to Baby and Me Book in Breastfeeding section Pg. 22-23 for position options and Proper latch demonstration.[facility]/LC brochure given w/resources, support groups and LC services. Patient Name: Claudia Ford Today's Date: 07/05/2015 Reason for consult: Initial assessment   Maternal Data Has patient been taught Hand Expression?: Yes Does the patient have breastfeeding experience prior to this delivery?: Yes  Feeding Feeding Type: Breast Fed Length of feed: 0 min  LATCH Score/Interventions Latch: Too sleepy or reluctant, no latch achieved, no sucking elicited. Intervention(s): Skin to skin;Teach feeding cues;Waking techniques Intervention(s): Breast compression;Breast massage;Assist with latch;Adjust position  Audible Swallowing: None Intervention(s): Alternate breast massage;Hand expression  Type of Nipple: Everted at rest and after stimulation  Comfort  (Breast/Nipple): Soft / non-tender     Hold (Positioning): Assistance needed to correctly position infant at breast and maintain latch. Intervention(s): Skin to skin;Position options;Support Pillows;Breastfeeding basics reviewed  LATCH Score: 5  Lactation Tools Discussed/Used     Consult Status Consult Status: Follow-up Date: 07/05/15 (in pm) Follow-up type: In-patient    Charyl DancerCARVER, Claudia Ford 07/05/2015, 4:10 AM

## 2015-07-05 NOTE — Progress Notes (Signed)
Post Partum Day 1 Subjective: no complaints, up ad lib and tolerating PO  Objective: Blood pressure 115/57, pulse 63, temperature 97.9 F (36.6 C), temperature source Oral, resp. rate 18, height 5\' 4"  (1.626 m), weight 171 lb (77.565 kg), last menstrual period 09/23/2014, SpO2 97 %, unknown if currently breastfeeding.  Physical Exam:  General: alert and cooperative Lochia: appropriate Uterine Fundus: firm Incision: n/a DVT Evaluation: No evidence of DVT seen on physical exam.   Recent Labs  07/04/15 0030 07/05/15 0600  HGB 11.4* 10.5*  HCT 33.8* 31.5*    Assessment/Plan: Plan for discharge tomorrow, Breastfeeding and Circumcision prior to discharge   LOS: 2 days   Hillary Struss 07/05/2015, 8:45 AM

## 2015-07-05 NOTE — Lactation Note (Signed)
This note was copied from the chart of Claudia [name]. Lactation Consultation Note  Experienced BF Mom reports that BF is going well and that she BF her older children for 1 year.  Jimmey Ralpharker has a recessed chin but he latches easily.  He started off with long suckles but then he needed stimulation to suckle at 24 hours of age.  Follow-up with mom prior to discharge.  Patient Name: Claudia Ford ZOXWR'UToday's Date: 07/05/2015     Maternal Data    Feeding Length of feed:  (30)  LATCH Score/Interventions                      Lactation Tools Discussed/Used     Consult Status      Soyla DryerJoseph, Cuong Moorman 07/05/2015, 2:51 PM

## 2015-07-05 NOTE — Anesthesia Postprocedure Evaluation (Signed)
  Anesthesia Post-op Note  Patient: Claudia FavorsMelissa A Ford  Procedure(s) Performed: * No procedures listed *  Patient Location: Mother/Baby  Anesthesia Type:Epidural  Level of Consciousness: awake  Airway and Oxygen Therapy: Patient Spontanous Breathing  Post-op Pain: mild  Post-op Assessment: Patient's Cardiovascular Status Stable and Respiratory Function Stable              Post-op Vital Signs: stable  Last Vitals:  Filed Vitals:   07/05/15 0509  BP: 115/57  Pulse: 63  Temp: 36.6 C  Resp: 18    Complications: No apparent anesthesia complications

## 2015-07-06 NOTE — Discharge Summary (Signed)
Obstetric Discharge Summary Reason for Admission: induction of labor Prenatal Procedures: none Intrapartum Procedures: vacuum Postpartum Procedures: none Complications-Operative and Postpartum: none HEMOGLOBIN  Date Value Ref Range Status  07/05/2015 10.5* 12.0 - 15.0 g/dL Final   HCT  Date Value Ref Range Status  07/05/2015 31.5* 36.0 - 46.0 % Final    Physical Exam:  General: alert Lochia: appropriate Uterine Fundus: firm Incision: healing well DVT Evaluation: No evidence of DVT seen on physical exam.  Discharge Diagnoses: Term Pregnancy-delivered  Discharge Information: Date: 07/06/2015 Activity: pelvic rest Diet: routine Medications: PNV and Ibuprofen Condition: stable Instructions: refer to practice specific booklet Discharge to: home Follow-up Information    Follow up with Meriel PicaHOLLAND,Manuella Blackson M, MD. Schedule an appointment as soon as possible for a visit in 6 weeks.   Specialty:  Obstetrics and Gynecology   Contact information:   [address] [place] [id number] [phone]       Newborn Data: Live born female  Birth Weight: 8 lb 9.7 oz (3905 g) APGAR: 7, 9  Home with mother.  Meriel PicaHOLLAND,Lashanti Chambless M 07/06/2015, 8:43 AM

## 2015-07-06 NOTE — Lactation Note (Signed)
This note was copied from the chart of Claudia Kevin FentonMelissa Ford. Lactation Consultation Note  Patient Name: Claudia Kevin FentonMelissa Antillon WUJWJ'XToday's Date: 07/06/2015 Reason for consult: Follow-up assessment ([facility] , per mom mom breast feeding is going well and LC visit not needed before D/C )   Maternal Data    Feeding Feeding Type: Breast Milk Length of feed: 15 min  LATCH Score/Interventions                      Lactation Tools Discussed/Used     Consult Status Consult Status: Complete Date: 07/06/15    Kathrin Greathouseorio, Zi Newbury Ann 07/06/2015, 10:23 AM

## 2015-10-01 ENCOUNTER — Ambulatory Visit (INDEPENDENT_AMBULATORY_CARE_PROVIDER_SITE_OTHER): Payer: 59 | Admitting: Sports Medicine

## 2015-10-01 ENCOUNTER — Encounter: Payer: Self-pay | Admitting: Sports Medicine

## 2015-10-01 VITALS — BP 94/71 | HR 67 | Ht 64.0 in | Wt 148.0 lb

## 2015-10-01 DIAGNOSIS — M25561 Pain in right knee: Secondary | ICD-10-CM

## 2015-10-01 NOTE — Progress Notes (Signed)
   Subjective:    Patient ID: Claudia Ford, female    DOB: 1981-02-25, 34 y.o.   MRN: [id number]  HPI 34 y/o female who presents with right knee pain of a few weeks duration.  She notices dull knee pain inferior, lateral, and medial to her patella that is worse during running and sometimes walking.  She began running 3 miles a few times a week after having a baby a few months ago and noticed it occurring once a day after she began running again.  However, she is more concerned about it "catching" upon full extension at times.  That causes her to have an abnormal gait when she walks. She reports that this did occur before she was pregnant, about a year ago, but the pain and catching stopped when she stopped running during her pregnancy.  She does have a history of plantar fasciitis in her left foot.  It acts up on occasion, but is not bothering her currently.  She has been going to Pilates and she has been given some exercises to do, which she does on occasion.  She denies joint swelling, warmth, radicular pain.    Review of Systems MSK: +joint pain, denies joint swelling Neuro: denies numbness, paresthesias    Objective:   Physical Exam MSK: Knee- No swelling, deformity, atrophy, erythema noted in bilateral knees upon sitting and standing.  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     -Due to catching,  will get knee x-rays to evaluate for loose body, OCD lesions.  Will call patient with x-ray results.     -Not likely due to meniscal injury due to lack of trauma and no joint swelling reported, McMurray's and Thessaly test negative.     -Gave exercises for VMO strengthening, which were demonstrated in office.  Handout given with instructions for increasing resistance.   -Can walk/run as tolerated with pain.  Follow up in 1 month to evaluate running gait.  Patient instructed to bring running shoes/clothes.  In addition to VMO weakness I suspect that this patient's symptoms are biomechanical and likely related to her running form.

## 2015-10-03 ENCOUNTER — Ambulatory Visit
Admission: RE | Admit: 2015-10-03 | Discharge: 2015-10-03 | Disposition: A | Discharge status: Home / Self Care | Payer: 59 | Source: Ambulatory Visit | Attending: Sports Medicine | Admitting: Sports Medicine

## 2015-10-03 ENCOUNTER — Other Ambulatory Visit: Payer: Self-pay | Admitting: Sports Medicine

## 2015-10-03 DIAGNOSIS — M25561 Pain in right knee: Secondary | ICD-10-CM

## 2015-10-12 ENCOUNTER — Telehealth: Payer: Self-pay | Admitting: Sports Medicine

## 2015-10-12 NOTE — Telephone Encounter (Signed)
I spoke with Claudia Ford on the phone today after reviewing the x-rays of her right knee. No OCD lesion. No loose bodies. 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 She has a follow-up appointment with me in a couple of weeks. I will plan on evaluating her running gait and form at that time.

## 2015-10-29 ENCOUNTER — Ambulatory Visit (INDEPENDENT_AMBULATORY_CARE_PROVIDER_SITE_OTHER): Payer: 59 | Admitting: Sports Medicine

## 2015-10-29 ENCOUNTER — Encounter: Payer: Self-pay | Admitting: Sports Medicine

## 2015-10-29 VITALS — BP 106/76 | HR 79 | Ht 64.0 in | Wt 148.0 lb

## 2015-10-29 DIAGNOSIS — M25561 Pain in right knee: Secondary | ICD-10-CM | POA: Diagnosis not present

## 2015-10-29 NOTE — Progress Notes (Signed)
   Subjective:    Patient ID: Claudia Ford, female    DOB: Apr 10, 1981, 34 y.o.   MRN: [id number]  HPI   Patient comes in today for follow-up on right knee pain. She is still getting intermittent catching and locking of the knee. X-rays showed patella alta but were otherwise unremarkable. No swelling. She has been diligent with her VMO strengthening exercises.    Review of Systems     Objective:   Physical Exam Well-developed, well-nourished. No acute distress  Right knee: Full range of motion. Less obvious J sign today than on previous exam. She is tender to palpation along the medial joint line with a positive McMurray's. No tenderness along the lateral joint line. Good ligamentous stability. Neurovascularly intact distally. She has hip weakness with resisted hip abduction.  Evaluation of her running form shows dynamic genu valgus. 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 She will add hip abductor strengthening to her VMO strengthening. I will call her with MRI results once available. We'll delineate more definitive treatment at that time.

## 2015-11-07 ENCOUNTER — Inpatient Hospital Stay: Admission: RE | Admit: 2015-11-07 | Payer: 59 | Source: Ambulatory Visit

## 2015-11-14 ENCOUNTER — Ambulatory Visit
Admission: RE | Admit: 2015-11-14 | Discharge: 2015-11-14 | Disposition: A | Discharge status: Home / Self Care | Payer: 59 | Source: Ambulatory Visit | Attending: Sports Medicine | Admitting: Sports Medicine

## 2015-11-14 DIAGNOSIS — M25561 Pain in right knee: Secondary | ICD-10-CM

## 2015-11-20 ENCOUNTER — Telehealth: Payer: Self-pay | Admitting: Sports Medicine

## 2015-11-20 NOTE — Telephone Encounter (Signed)
I spoke with the patient on the phone today about the MRI findings of her right knee. No meniscal tear. 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 She has a follow-up appointment with me on December 8. These findings are likely secondary to her hip and quad weakness. I did discuss a cortisone injection at her follow-up appointment if her pain warrants. I also recommended trying either Aleve or diclofenac (she has some leftover from a previous injury) before seeing me again on the eighth.

## 2015-11-29 ENCOUNTER — Encounter: Payer: Self-pay | Admitting: Sports Medicine

## 2015-11-29 ENCOUNTER — Ambulatory Visit (INDEPENDENT_AMBULATORY_CARE_PROVIDER_SITE_OTHER): Payer: 59 | Admitting: Sports Medicine

## 2015-11-29 VITALS — BP 106/72 | Ht 64.0 in | Wt 150.0 lb

## 2015-11-29 DIAGNOSIS — M2241 Chondromalacia patellae, right knee: Secondary | ICD-10-CM

## 2015-11-29 DIAGNOSIS — M25561 Pain in right knee: Secondary | ICD-10-CM

## 2015-11-29 MED ORDER — METHYLPREDNISOLONE ACETATE 40 MG/ML IJ SUSP
40.0000 mg | Freq: Once | INTRAMUSCULAR | Status: AC
  Administered 2015-11-29: 40 mg via INTRA_ARTICULAR

## 2015-11-29 NOTE — Progress Notes (Signed)
   Subjective:    Patient ID: Ruel FavorsMelissa A Orchard, female    DOB: 1981/09/19, 34 y.o.   MRN: [id number]  HPI   Patient comes in today for follow-up. An MRI scan of her right knee did not show any meniscal pathology. She has some mild chondromalacia patella as well as some mild edema in Hoffa's fat pad. Otherwise unremarkable. She continues to try to be active but her knee pain prevents her from this. She has been working with one of the local physical therapists but is not doing formal physical therapy. She has tried over-the-counter Aleve but her symptoms persist. She is concerned about chronic anti-inflammatory usage due to the fact that she is currently breast-feeding.    Review of Systems     Objective:   Physical Exam Well-developed, well-nourished. No acute distress. Awake alert and oriented 3. Vital signs reviewed  Right knee: Full range of motion. No effusion. There is some tethering of the patella laterally. No patellofemoral crepitus. Negative McMurray's. Good joint stability. Neurovascular intact distally.  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 An anterior lateral approach was utilized. She tolerates this without difficulty. I think she needs formal physical therapy. I've also given her a prescription for a standing desk to use at work. Follow-up with me in 6 weeks.  Consent obtained and verified. Time-out conducted. Noted no overlying erythema, induration, or other signs of local infection. Skin prepped in a sterile fashion. Topical analgesic spray: Ethyl chloride. Joint: right knee Needle: 25g 1.5 inch Completed without difficulty. Meds: 3cc 1% xylocaine, 1cc (40mg ) depomedrol  Advised to call if fevers/chills, erythema, induration, drainage, or persistent bleeding.

## 2015-12-10 ENCOUNTER — Ambulatory Visit: Payer: 59 | Admitting: Physical Therapy

## 2016-01-09 ENCOUNTER — Ambulatory Visit: Payer: 59 | Admitting: Sports Medicine

## 2016-04-25 ENCOUNTER — Telehealth (HOSPITAL_COMMUNITY): Payer: Self-pay | Admitting: Lactation Services

## 2016-04-25 NOTE — Telephone Encounter (Signed)
Mom called C/O of red, peeling nipples with pumping that are painful from the pulling of the pump. Baby now 710 months old. Mom feels flange size is correct, no friction with pumping. Concerned about yeast on nipples so she has made her own version of AP [place] by combining cortisone, polysporin and miconazole. Mom reports some improvement since starting this. Mom has not looked in baby's mouth for thrush. Mom denies changing any detergents or personal care products. No history of psoriasis. Advised to stop sugar in her diet, start probiotics, boil baby's nipples/pacifier daily, wash/bleach bras. Keep nipples open to air when possible, do not keep moist pads against nipples. Referred to Dr. Yevette EdwardsJack Newman's web site for information on thrust/candiasis. Mom may start gentian violet, advised to have diluted by pharmacy to 1% if she uses this. If no continued improvement call OB or dermatologist for evaluation.

## 2016-05-13 IMAGING — CR DG KNEE COMPLETE 4+V*R*
3 series · 3 of 3 positions shown · non-contrast
Comparison: None in PACs

CLINICAL DATA: Two weeks of right knee pain, patient is a runner,
no known injury.

EXAM:
RIGHT KNEE - COMPLETE 4+ VIEW

[w knee ap right]
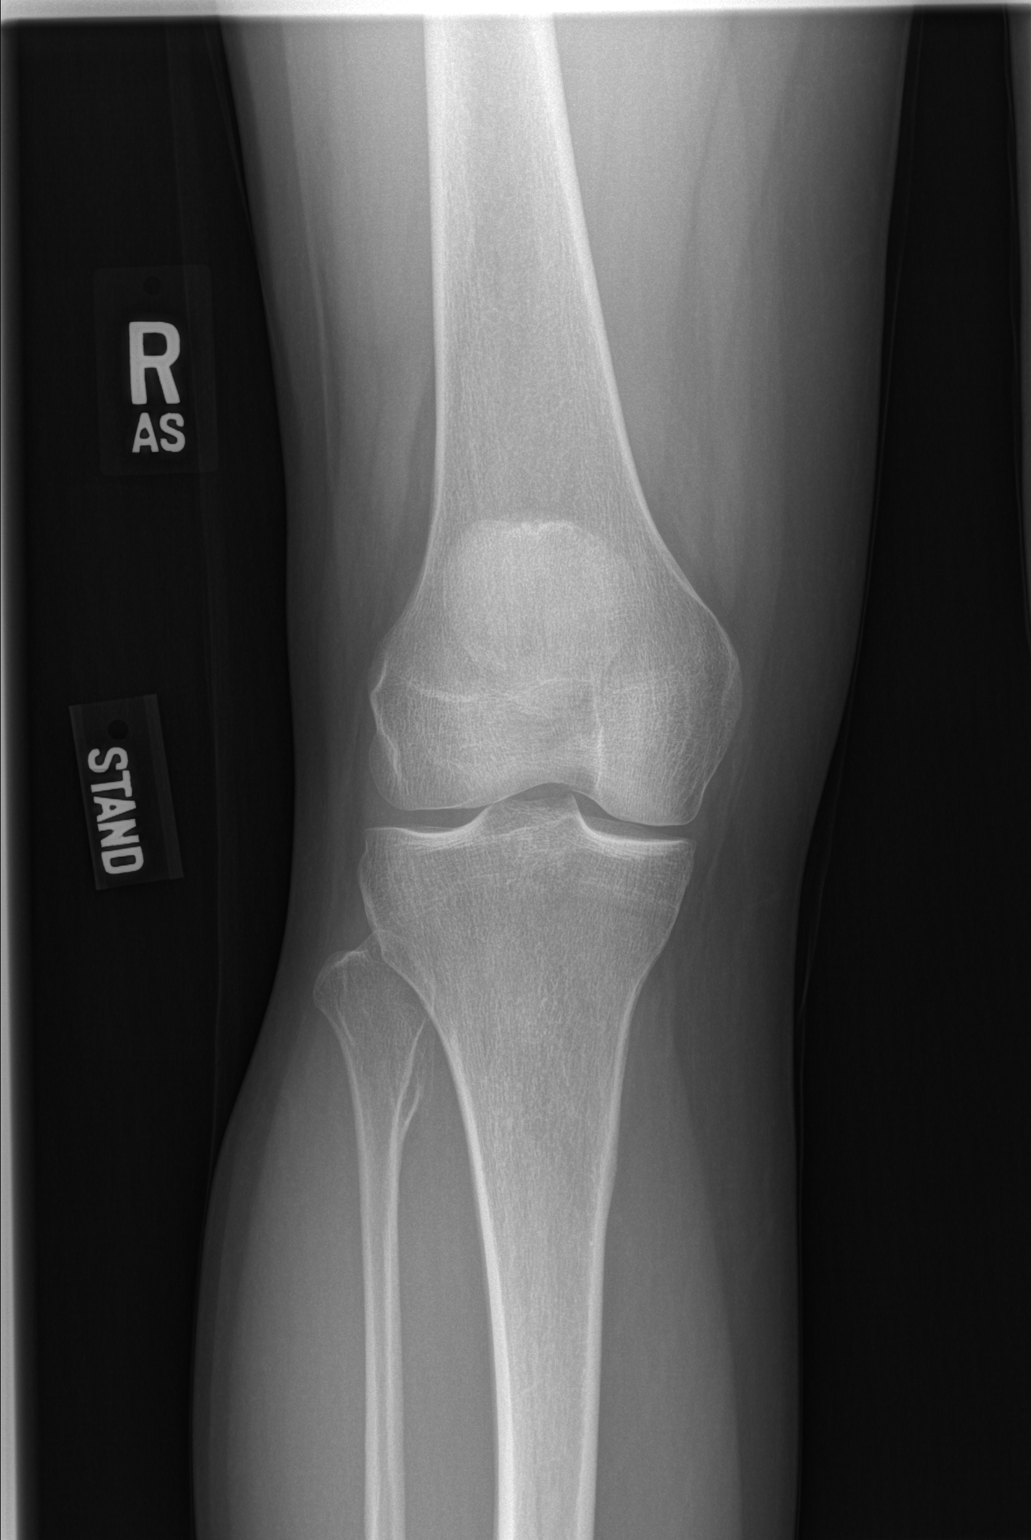

[w knee lat. right]
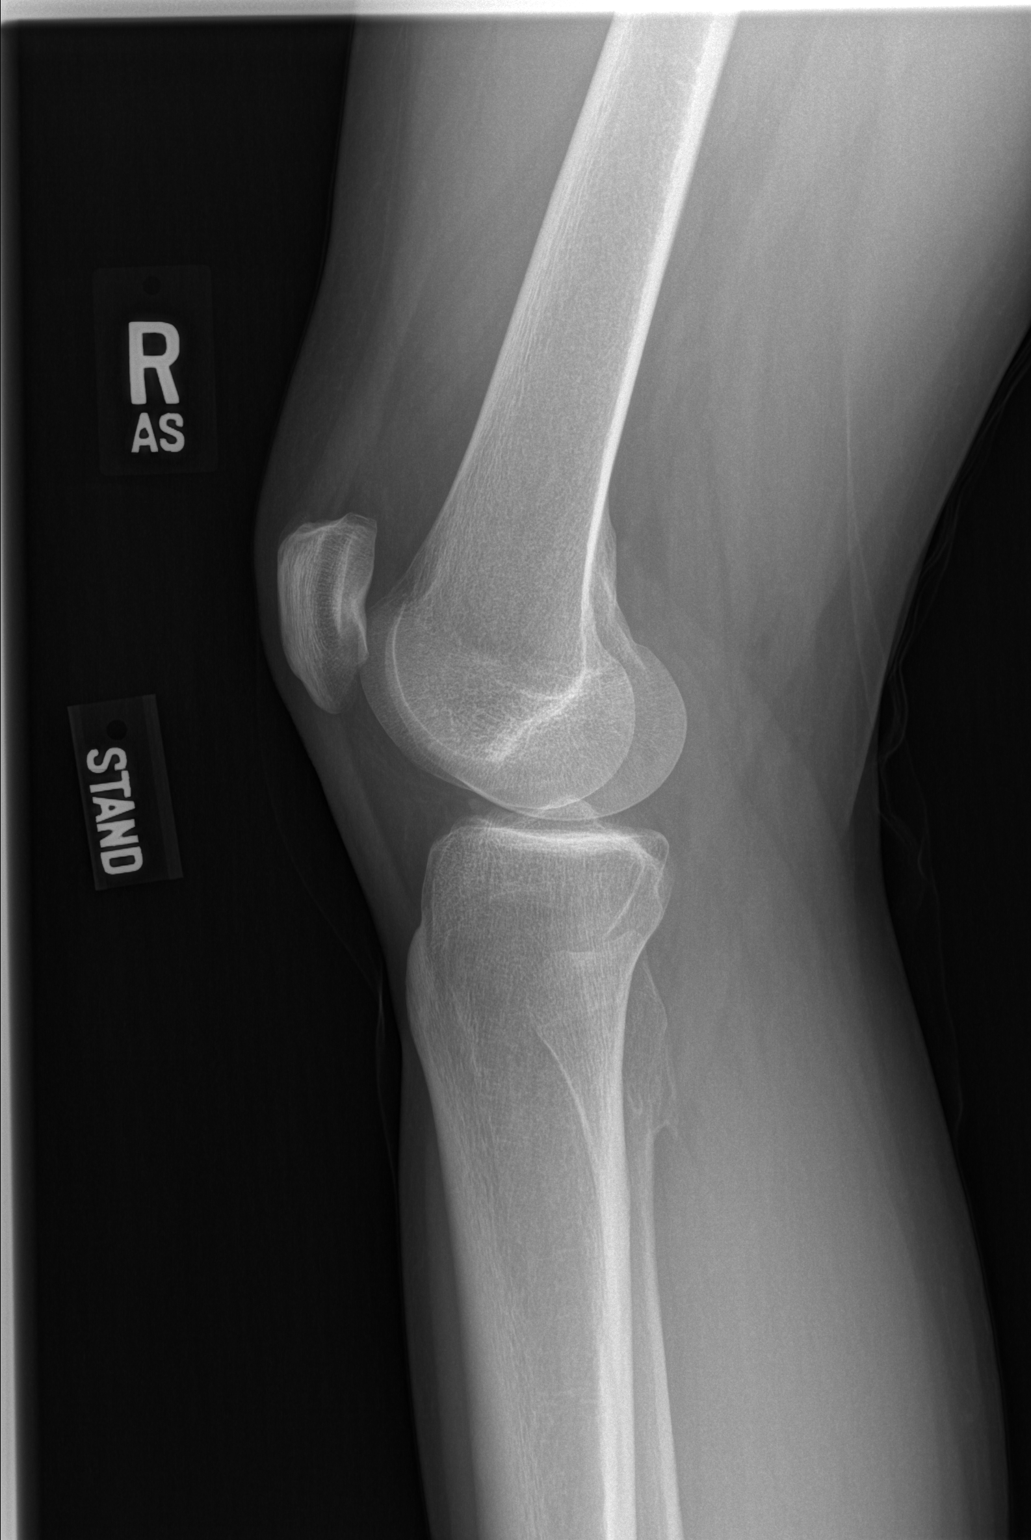

[w knee obl. right]
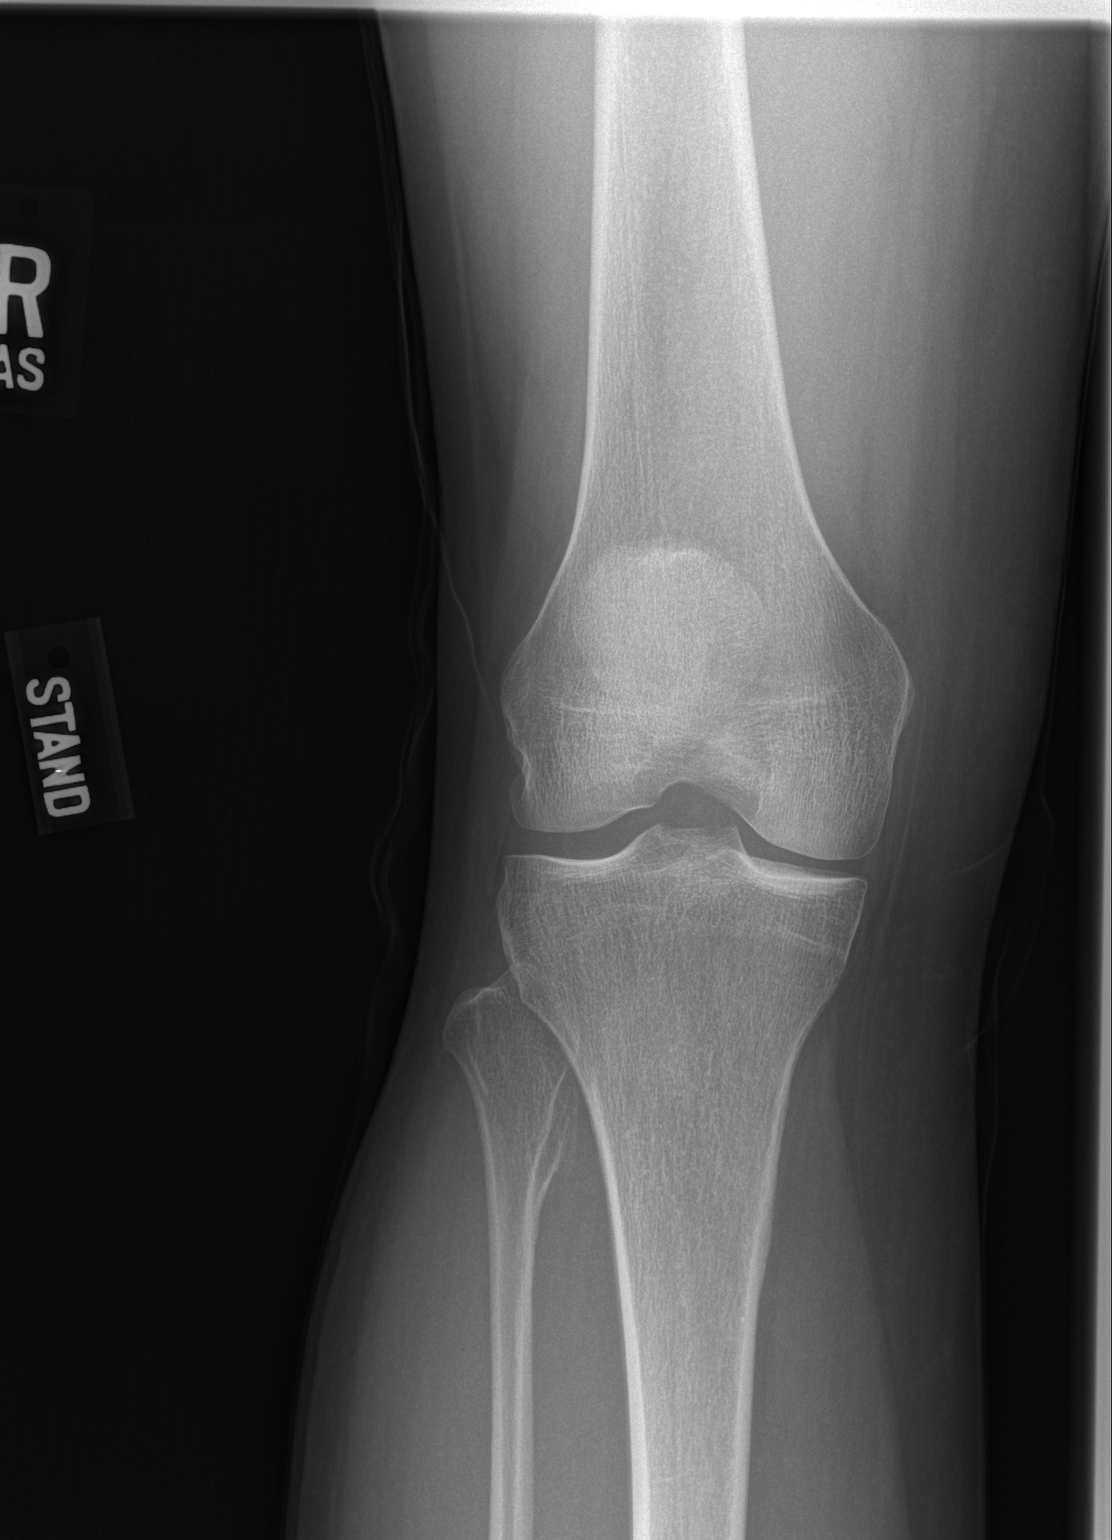

[3 of 3 positions shown; findings below may reference images not displayed]

FINDINGS: The bones are adequately mineralized. The joint spaces are
reasonably well-maintained. The proximal fibula is intact. A small
tug lesion is associated with the posterior cortex of the right
fibular metaphysis. There is no chondrocalcinosis or joint effusion.
The patella appears appropriately positioned.
IMPRESSION: There is no acute or significant chronic bony abnormality of the
left knee.

## 2017-02-25 DIAGNOSIS — S93492A Sprain of other ligament of left ankle, initial encounter: Secondary | ICD-10-CM | POA: Diagnosis not present

## 2017-03-17 DIAGNOSIS — M9902 Segmental and somatic dysfunction of thoracic region: Secondary | ICD-10-CM | POA: Diagnosis not present

## 2017-03-17 DIAGNOSIS — M9905 Segmental and somatic dysfunction of pelvic region: Secondary | ICD-10-CM | POA: Diagnosis not present

## 2017-03-17 DIAGNOSIS — M9903 Segmental and somatic dysfunction of lumbar region: Secondary | ICD-10-CM | POA: Diagnosis not present

## 2017-03-18 DIAGNOSIS — M9902 Segmental and somatic dysfunction of thoracic region: Secondary | ICD-10-CM | POA: Diagnosis not present

## 2017-03-18 DIAGNOSIS — M9903 Segmental and somatic dysfunction of lumbar region: Secondary | ICD-10-CM | POA: Diagnosis not present

## 2017-03-18 DIAGNOSIS — M9905 Segmental and somatic dysfunction of pelvic region: Secondary | ICD-10-CM | POA: Diagnosis not present

## 2017-03-20 DIAGNOSIS — M9902 Segmental and somatic dysfunction of thoracic region: Secondary | ICD-10-CM | POA: Diagnosis not present

## 2017-03-20 DIAGNOSIS — M9905 Segmental and somatic dysfunction of pelvic region: Secondary | ICD-10-CM | POA: Diagnosis not present

## 2017-03-20 DIAGNOSIS — M9903 Segmental and somatic dysfunction of lumbar region: Secondary | ICD-10-CM | POA: Diagnosis not present

## 2017-03-25 DIAGNOSIS — M9902 Segmental and somatic dysfunction of thoracic region: Secondary | ICD-10-CM | POA: Diagnosis not present

## 2017-03-25 DIAGNOSIS — M9903 Segmental and somatic dysfunction of lumbar region: Secondary | ICD-10-CM | POA: Diagnosis not present

## 2017-03-25 DIAGNOSIS — M9905 Segmental and somatic dysfunction of pelvic region: Secondary | ICD-10-CM | POA: Diagnosis not present

## 2017-04-01 DIAGNOSIS — M9903 Segmental and somatic dysfunction of lumbar region: Secondary | ICD-10-CM | POA: Diagnosis not present

## 2017-04-01 DIAGNOSIS — M9902 Segmental and somatic dysfunction of thoracic region: Secondary | ICD-10-CM | POA: Diagnosis not present

## 2017-04-01 DIAGNOSIS — M9905 Segmental and somatic dysfunction of pelvic region: Secondary | ICD-10-CM | POA: Diagnosis not present

## 2017-04-03 DIAGNOSIS — Z13220 Encounter for screening for lipoid disorders: Secondary | ICD-10-CM | POA: Diagnosis not present

## 2017-04-03 DIAGNOSIS — Z0000 Encounter for general adult medical examination without abnormal findings: Secondary | ICD-10-CM | POA: Diagnosis not present

## 2017-04-08 DIAGNOSIS — M9903 Segmental and somatic dysfunction of lumbar region: Secondary | ICD-10-CM | POA: Diagnosis not present

## 2017-04-08 DIAGNOSIS — M9905 Segmental and somatic dysfunction of pelvic region: Secondary | ICD-10-CM | POA: Diagnosis not present

## 2017-04-08 DIAGNOSIS — M9902 Segmental and somatic dysfunction of thoracic region: Secondary | ICD-10-CM | POA: Diagnosis not present

## 2017-04-13 ENCOUNTER — Ambulatory Visit: Payer: 59 | Attending: Family Medicine | Admitting: Physical Therapy

## 2017-04-13 ENCOUNTER — Encounter: Payer: Self-pay | Admitting: Physical Therapy

## 2017-04-13 DIAGNOSIS — M6281 Muscle weakness (generalized): Secondary | ICD-10-CM | POA: Diagnosis not present

## 2017-04-13 DIAGNOSIS — M62838 Other muscle spasm: Secondary | ICD-10-CM | POA: Insufficient documentation

## 2017-04-13 DIAGNOSIS — M545 Low back pain, unspecified: Secondary | ICD-10-CM

## 2017-04-13 NOTE — Therapy (Signed)
[facility] [address], [address] [place], [place], [address] Phone: [phone]   Fax:  [phone]  Physical Therapy Evaluation  Patient Details  Name: Claudia Ford MRN: [id number] Date of Birth: 1981/10/02 Referring Provider: Dr. Mila Palmer  Encounter Date: 04/13/2017      PT End of Session - 04/13/17 1316    Visit Number 1   Date for PT Re-Evaluation 06/08/17   Authorization Type [employer]   PT Start Time 1230   PT Stop Time 1313   PT Time Calculation (min) 43 min   Activity Tolerance Patient tolerated treatment well   Behavior During Therapy [facility] for tasks assessed/performed      Past Medical History:  Diagnosis Date  . Abnormal Pap smear   . Hx of varicella   . Vaginal Pap smear, abnormal     Past Surgical History:  Procedure Laterality Date  . COLPOSCOPY    . NO PAST SURGERIES      There were no vitals filed for this visit.       Subjective Assessment - 04/13/17 1236    Subjective Pain in right hip after first pregnancy in 2011. Pain went away after pregnancy and happened again with other 2 pregnancies.  Past 1 month the pain has been exacerbated.    How long can you sit comfortably? sit to stand increase pain   How long can you stand comfortably? sightly   How long can you walk comfortably? increased pain   Patient Stated Goals reduce pain    Currently in Pain? 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 Mila Palmer   Onset Date/Surgical Date 02/19/17   Prior Therapy chiropractor past month     Precautions   Precautions None     Restrictions   Weight Bearing Restrictions No     Balance Screen   Has the patient fallen in the past 6 months No   Has the patient had a decrease in activity level because of a fear of falling?  No   Is the patient reluctant to leave their home because of a fear of falling?  No     Home Tourist information centre manager residence     Prior Function   Level of Independence Independent   Vocation Full time employment   Vocation Requirements sitting   Leisure yoga, interval training for 30 min     Cognition   Overall Cognitive Status Within Functional Limits for tasks assessed     Observation/Other Assessments   Focus on Therapeutic Outcomes (FOTO)  45% limitation for bowel     ROM / Strength   AROM / PROM / Strength AROM;PROM;Strength     AROM   Overall AROM Comments lumbar extension decreased by 25%; coming ot neutral wiht shift     Strength  Right Hip ABduction 3/5   Right Hip ADduction 3/5   Left Hip ABduction 3/5   Left Hip ADduction 3/5     Palpation   SI assessment  right ilium is rotated anteriorly; sacrum rotated left   Palpation comment tenderness located in right lower abdominal,      Special Tests    Special Tests Sacrolliac Tests   Sacroiliac Tests  Pelvic Distraction     Pelvic Dictraction   Findings Positive   Side  Right   Comment pain                 Pelvic Floor Special Questions - 04/13/17 0001    Prior Pregnancies Yes   Number of Pregnancies 3   Number of Vaginal Deliveries 3   Any difficulty with labor and deliveries No   Diastasis Recti none   Currently Sexually Active Yes   Is this Painful No   Urinary Leakage No   Fecal incontinence --  trouble finishing the bowel movement; type 4 bowel   Skin Integrity Intact   Perineal Body/Introitus  Normal    External Palpation ability to move through the excursion   Pelvic Floor Internal Exam Patient confirms identification and approves PT to assess muscles   Exam Type Vaginal   Palpation tenderness located on bilateral levator ani, obturator internist, decreased mobility of bil. sphincter urethra   Strength fair squeeze, definite lift  after treatment increased to 4/5   Tone increased          [facility] Adult PT Treatment/Exercise - 04/13/17 0001      Manual Therapy   Manual Therapy Muscle Energy Technique   Muscle Energy Technique correct right ilium                PT Education - 04/13/17 1314    Education provided Yes   Education Details how to perform perineal soft tissue work   [employer]) Educated Patient   Methods Explanation;Demonstration   Comprehension Verbalized understanding;Returned demonstration          PT Short Term Goals - 04/13/17 1409      PT SHORT TERM GOAL #1   Title independent with initial HEP   Time 4   Period Weeks   Status New     PT SHORT TERM GOAL #2   Title pain with going from sit to stand decreased >/= 25% due to pelvis in correct alignment   Time 4   Period Weeks   Status New     PT SHORT TERM GOAL #3   Title pain with walking decreased >/= 25% due to reduction of muscle spasms in pelvic floor muscles   Time 4   Period Weeks   Status New     PT SHORT TERM GOAL #4   Title Pain with bending decreased >/= 25% due to improved mobility   Time 4   Period Weeks   Status New     PT SHORT TERM GOAL #5   Title ability to push stools out fully with >/= 25% greater ease   Time 4   Period Weeks   Status New           PT Long Term Goals - 04/13/17 1411      PT LONG TERM GOAL #1   Title independent with HEP   Time 8   Period Weeks   Status New     PT LONG TERM GOAL #2   Title sit to stand with pain decreased >/=  75% due to increased strength in hips and pelvic floor >/= 4/5   Time 8   Period Weeks   Status New     PT  LONG TERM GOAL #3   Title walking with pain decreased >/= 75% due to reduction in pelvic floor muscle spasms   Time 8   Period Weeks   Status New     PT LONG TERM GOAL #4   Title pain with bending to pick up her kids decreased >/= 75%   Time 8   Period Weeks   Status New     PT LONG TERM GOAL #5   Title ability to fully push all of her stools out during a bowel movement due to reduction in pelvis floor muscles spasms   Time 8   Period Weeks   Status New               Plan - 04/13/17 1317    Clinical Impression Statement Patient is a 36 year female with back and pelvic floor.  Patient reports intermittent pain ranging 1-8/10. Patient reports pain is worse with sti to stand, walking, squatting, and bending. Pelvic floor strength is 2/5 and after soft tissue work changed to 3/5. Palpable tenderness located in bilateral levator ani, obturator internist and decreased contraction of right urethra sphincter.  Patient reports difficulty pushing a bowel movement fully out.  Right ilium is rotated anteriorly and sacrum is rotated left.  Bilateral hip abduction and adduction strength is 3/5.  Patient reports no urinary leakage.  Patient is low complex evaluation due to a stable condition and no comorbidities that will impact care. Patient will benefit from strengthening muscles and reducing pain to restore function.    Rehab Potential Excellent   Clinical Impairments Affecting Rehab Potential None   PT Frequency 1x / week   PT Duration 8 weeks   PT Treatment/Interventions Biofeedback;Electrical Stimulation;Cryotherapy;Moist Heat;Ultrasound;Patient/family education;Neuromuscular re-education;Therapeutic exercise;Therapeutic activities;Manual techniques;Dry needling;Taping   PT Next Visit Plan soft tissue work to pelvic floor muscles; see if ilium is corrected and correct sacrum; hip strength   PT Home Exercise Plan progress as needed   Recommended Other Services None   Consulted and Agree  with Plan of Care Patient      Patient will benefit from skilled therapeutic intervention in order to improve the following deficits and impairments:  Pain, Decreased strength, Increased muscle spasms, Decreased range of motion, Decreased mobility, Decreased activity tolerance, Decreased endurance  Visit Diagnosis: Muscle weakness (generalized) - Plan: PT plan of care cert/re-cert  Acute bilateral low back pain without sciatica - Plan: PT plan of care cert/re-cert  Other muscle spasm - Plan: PT plan of care cert/re-cert     Problem List Patient Active Problem List   Diagnosis Date Noted  . Post-dates pregnancy 07/04/2015    Eulis Foster, PT 04/13/17 2:16 PM   [facility] [address] [place], [place], [address] Phone: [phone]   Fax:  [phone]  Name: AUDEN WETTSTEIN MRN: [id number] Date of Birth: [birth year]

## 2017-04-15 DIAGNOSIS — M9905 Segmental and somatic dysfunction of pelvic region: Secondary | ICD-10-CM | POA: Diagnosis not present

## 2017-04-15 DIAGNOSIS — M9902 Segmental and somatic dysfunction of thoracic region: Secondary | ICD-10-CM | POA: Diagnosis not present

## 2017-04-15 DIAGNOSIS — M9903 Segmental and somatic dysfunction of lumbar region: Secondary | ICD-10-CM | POA: Diagnosis not present

## 2017-04-21 ENCOUNTER — Encounter: Payer: 59 | Admitting: Physical Therapy

## 2017-04-28 ENCOUNTER — Ambulatory Visit: Payer: 59 | Attending: Family Medicine | Admitting: Physical Therapy

## 2017-04-28 ENCOUNTER — Encounter: Payer: Self-pay | Admitting: Physical Therapy

## 2017-04-28 DIAGNOSIS — M6281 Muscle weakness (generalized): Secondary | ICD-10-CM | POA: Insufficient documentation

## 2017-04-28 DIAGNOSIS — M62838 Other muscle spasm: Secondary | ICD-10-CM | POA: Diagnosis not present

## 2017-04-28 DIAGNOSIS — M545 Low back pain, unspecified: Secondary | ICD-10-CM

## 2017-04-28 NOTE — Patient Instructions (Addendum)
Strengthening: Hip Abduction (Side-Lying)    Tighten muscles on front of left thigh, then lift leg __6__ inches from surface, keeping knee locked. Press bottom leg into may. Contract pelvic floor.  Repeat _15___ times per set. Do _1___ sets per session. Do _1___ sessions per day.  http://orth.exer.us/622   Copyright  VHI. All rights reserved.  Strengthening: Hip Adduction (Side-Lying)    Tighten muscles on front of right thigh, then lift leg __2__ inches from surface, keeping knee locked.  Repeat _15___ times per set. Do __1__ sets per session. Do _1___ sessions per day. Pull up pelvic floor and tight abdominals.  http://orth.exer.us/624   Copyright  VHI. All rights reserved.   Bracing With Bridging (Hook-Lying)    With neutral spine, tighten pelvic floor and abdominals and hold. Lift bottom. Repeat _20__ times. Do __1_ times a day.   Copyright  VHI. All rights reserved.  Bracing With Arms / Legs (Hook-Lying)    With neutral spine, tighten pelvic floor and abdominals and hold. Raise arm and opposite leg, then return. Repeat wtih other limbs. Repeat _10__ times. Do _1__ times a day.   Copyright  VHI. All rights reserved.    1. Position yourself as shown, grabbing onto the feet or behind the knees; you should feel a gentle stretch.  2. Breathe in and allow the pelvic floor muscles to relax.  3. Hold this position for 2-3 minutes.  Bracing With Leg Lowering (Hook-Lying)    With neutral spine, tighten pelvic floor and abdominals and hold. Straighten one knee, lifting leg. Lower straight leg to floor, then return to bent position. Repeat with other leg. Repeat _10__ times. Do _1__ times a day.   Copyright  VHI. All rights reserved.  Puppy Dog Pose    From table walk hands out into pose. Deepen chest downward if shoulders allow. Hold for _30___ breaths. Repeat __1__ times.  Copyright  VHI. All rights reserved.  BACK: Child's Pose (Sciatica)    Sit in  knee-chest position and reach arms forward. Separate knees for comfort. Hold position for 30___ breaths. Repeat __1_ times. Do __1_ times per day.  Copyright  VHI. All rights reserved.    While in a sitting position, bend your knees and place the bottom of your feet together.   Next, slowly let your knees lower towards the floor until a stretch is felt at your inner thighs.  [facility] [address], [address] [place], [place] [address] Phone # [phone] Fax [phone]

## 2017-04-28 NOTE — Therapy (Addendum)
[facility] [address], [place], [place], [address] Phone: [phone]   Fax:  [phone]  Physical Therapy Treatment  Patient Details  Name: Claudia Ford MRN: [id number] Date of Birth: 01/11/1981 Referring Provider: Dr. Jonathon Jordan  Encounter Date: 04/28/2017      PT End of Session - 04/28/17 1617    Visit Number 2   Date for PT Re-Evaluation 06/08/17   Authorization Type [employer]   PT Start Time 1532   PT Stop Time 1615   PT Time Calculation (min) 43 min   Activity Tolerance Patient tolerated treatment well   Behavior During Therapy [facility] for tasks assessed/performed      Past Medical History:  Diagnosis Date  . Abnormal Pap smear   . Hx of varicella   . Vaginal Pap smear, abnormal     Past Surgical History:  Procedure Laterality Date  . COLPOSCOPY    . NO PAST SURGERIES      There were no vitals filed for this visit.      Subjective Assessment - 04/28/17 1535    Subjective NO changes from the initial. Sit to stand 15% better. Walking 30% better.    How long can you sit comfortably? sit to stand increase pain   How long can you stand comfortably? sightly   How long can you walk comfortably? increased pain   Patient Stated Goals reduce pain    Currently in Pain? Yes   Pain Score 3    Pain Location Buttocks   Pain Orientation Left   Pain Descriptors / Indicators Dull   Pain Type Acute pain   Pain Radiating Towards radiates around hips   Pain Onset More than a month ago   Pain Frequency Intermittent   Aggravating Factors  sit to stand, walking, squatting, bending over, getting up from a lying down position   Pain Relieving Factors heat, laying on side            [facility] PT Assessment - 04/28/17 0001      Palpation   SI assessment  right ilium is rotated anteriorly; sacrum rotated left                  Pelvic Floor Special Questions - 04/28/17 0001    Pelvic Floor  Internal Exam Patient confirms identification and approves PT to assess muscles   Exam Type Vaginal   Palpation tightness on right pelvic floor compared to left           [facility] Adult PT Treatment/Exercise - 04/28/17 0001      Manual Therapy   Manual Therapy Joint mobilization;Muscle Energy Technique   Joint Mobilization rotational mobilization to L3-L5 and sacral mobilization to sacrum   Muscle Energy Technique correct right ilium                PT Education - 04/28/17 1616    Education provided Yes   Education Details stretches and hip strength   Person(s) Educated Patient   Methods Explanation;Demonstration;Verbal cues;Handout   Comprehension Returned demonstration;Verbalized understanding          PT Short Term Goals - 04/28/17 1620      PT SHORT TERM GOAL #1   Title independent with initial HEP   Time 4   Period Weeks   Status On-going     PT SHORT TERM GOAL #2   Title pain with going from sit to stand decreased >/= 25% due to pelvis in correct  alignment   Time 4   Period Weeks   Status On-going  15% better     PT SHORT TERM GOAL #3   Title pain with walking decreased >/= 25% due to reduction of muscle spasms in pelvic floor muscles   Time 4   Period Weeks   Status Achieved     PT SHORT TERM GOAL #4   Title Pain with bending decreased >/= 25% due to improved mobility   Time 4   Period Weeks   Status On-going     PT SHORT TERM GOAL #5   Title ability to push stools out fully with >/= 25% greater ease   Time 4   Period Weeks   Status On-going           PT Long Term Goals - 04/13/17 1411      PT LONG TERM GOAL #1   Title independent with HEP   Time 8   Period Weeks   Status New     PT LONG TERM GOAL #2   Title sit to stand with pain decreased >/= 75% due to increased strength in hips and pelvic floor >/= 4/5   Time 8   Period Weeks   Status New     PT LONG TERM GOAL #3   Title walking with pain decreased >/= 75% due to  reduction in pelvic floor muscle spasms   Time 8   Period Weeks   Status New     PT LONG TERM GOAL #4   Title pain with bending to pick up her kids decreased >/= 75%   Time 8   Period Weeks   Status New     PT LONG TERM GOAL #5   Title ability to fully push all of her stools out during a bowel movement due to reduction in pelvis floor muscles spasms   Time 8   Period Weeks   Status New               Plan - 04/28/17 1618    Clinical Impression Statement Patient has more tightness on right pelvic floor muscles compared to left. During soft tissue work there was less pain on right pelvic floor compared to initial evaluation. Walking is 30% better and sit to stand is 15% better.  Patient pelvis in correct alignment aftre therapy.  Patient has not met goals yet due to just starting therapy.  Patient will benefit from skilled therapy to strengthen muscles and reduce pain to improve function.    Rehab Potential Excellent   Clinical Impairments Affecting Rehab Potential None   PT Frequency 1x / week   PT Duration 8 weeks   PT Treatment/Interventions Biofeedback;Electrical Stimulation;Cryotherapy;Moist Heat;Ultrasound;Patient/family education;Neuromuscular re-education;Therapeutic exercise;Therapeutic activities;Manual techniques;Dry needling;Taping   PT Next Visit Plan check pelvic alignment; core strength; toileting technique   PT Home Exercise Plan progress as needed   Consulted and Agree with Plan of Care Patient      Patient will benefit from skilled therapeutic intervention in order to improve the following deficits and impairments:  Pain, Decreased strength, Increased muscle spasms, Decreased range of motion, Decreased mobility, Decreased activity tolerance, Decreased endurance  Visit Diagnosis: Muscle weakness (generalized)  Acute bilateral low back pain without sciatica  Other muscle spasm     Problem List Patient Active Problem List   Diagnosis Date Noted  .  Post-dates pregnancy 07/04/2015    Earlie Counts, PT 04/28/17 4:22 PM   [facility] [address]  [address], [place], [place], [address] Phone: [phone]   Fax:  [phone]  Name: Claudia Ford MRN: [id number] Date of Birth: [birth year]  PHYSICAL THERAPY DISCHARGE SUMMARY  Visits from Start of Care: 2  Current functional level related to goals / functional outcomes: See above.  Patient called on 05/04/2017 to be discharged due to the out of pocket expense.     Remaining deficits: See above.    Education / Equipment: HEP Plan: Patient agrees to discharge.  Patient goals were not met. Patient is being discharged due to financial reasons.  Thank you for the referral. Earlie Counts, PT 05/04/17 3:17 PM  ?????

## 2017-04-30 DIAGNOSIS — M9903 Segmental and somatic dysfunction of lumbar region: Secondary | ICD-10-CM | POA: Diagnosis not present

## 2017-04-30 DIAGNOSIS — M9905 Segmental and somatic dysfunction of pelvic region: Secondary | ICD-10-CM | POA: Diagnosis not present

## 2017-04-30 DIAGNOSIS — M9902 Segmental and somatic dysfunction of thoracic region: Secondary | ICD-10-CM | POA: Diagnosis not present

## 2017-05-05 ENCOUNTER — Encounter: Payer: 59 | Admitting: Physical Therapy

## 2017-05-12 ENCOUNTER — Encounter: Payer: 59 | Admitting: Physical Therapy

## 2017-05-13 DIAGNOSIS — M9903 Segmental and somatic dysfunction of lumbar region: Secondary | ICD-10-CM | POA: Diagnosis not present

## 2017-05-13 DIAGNOSIS — M9905 Segmental and somatic dysfunction of pelvic region: Secondary | ICD-10-CM | POA: Diagnosis not present

## 2017-05-13 DIAGNOSIS — M9902 Segmental and somatic dysfunction of thoracic region: Secondary | ICD-10-CM | POA: Diagnosis not present

## 2017-05-19 ENCOUNTER — Encounter: Payer: 59 | Admitting: Physical Therapy

## 2017-05-20 DIAGNOSIS — M9903 Segmental and somatic dysfunction of lumbar region: Secondary | ICD-10-CM | POA: Diagnosis not present

## 2017-05-20 DIAGNOSIS — M9902 Segmental and somatic dysfunction of thoracic region: Secondary | ICD-10-CM | POA: Diagnosis not present

## 2017-05-20 DIAGNOSIS — M9905 Segmental and somatic dysfunction of pelvic region: Secondary | ICD-10-CM | POA: Diagnosis not present

## 2017-05-29 DIAGNOSIS — M9903 Segmental and somatic dysfunction of lumbar region: Secondary | ICD-10-CM | POA: Diagnosis not present

## 2017-05-29 DIAGNOSIS — M9902 Segmental and somatic dysfunction of thoracic region: Secondary | ICD-10-CM | POA: Diagnosis not present

## 2017-05-29 DIAGNOSIS — M9905 Segmental and somatic dysfunction of pelvic region: Secondary | ICD-10-CM | POA: Diagnosis not present

## 2017-06-03 DIAGNOSIS — M9905 Segmental and somatic dysfunction of pelvic region: Secondary | ICD-10-CM | POA: Diagnosis not present

## 2017-06-03 DIAGNOSIS — M9903 Segmental and somatic dysfunction of lumbar region: Secondary | ICD-10-CM | POA: Diagnosis not present

## 2017-06-03 DIAGNOSIS — M9902 Segmental and somatic dysfunction of thoracic region: Secondary | ICD-10-CM | POA: Diagnosis not present

## 2017-06-12 DIAGNOSIS — M9903 Segmental and somatic dysfunction of lumbar region: Secondary | ICD-10-CM | POA: Diagnosis not present

## 2017-06-12 DIAGNOSIS — M9905 Segmental and somatic dysfunction of pelvic region: Secondary | ICD-10-CM | POA: Diagnosis not present

## 2017-06-12 DIAGNOSIS — M9902 Segmental and somatic dysfunction of thoracic region: Secondary | ICD-10-CM | POA: Diagnosis not present

## 2017-10-13 DIAGNOSIS — Z23 Encounter for immunization: Secondary | ICD-10-CM | POA: Diagnosis not present

## 2017-11-17 DIAGNOSIS — Z01419 Encounter for gynecological examination (general) (routine) without abnormal findings: Secondary | ICD-10-CM | POA: Diagnosis not present

## 2017-11-17 DIAGNOSIS — Z1231 Encounter for screening mammogram for malignant neoplasm of breast: Secondary | ICD-10-CM | POA: Diagnosis not present

## 2018-02-17 DIAGNOSIS — N816 Rectocele: Secondary | ICD-10-CM | POA: Diagnosis not present

## 2018-10-12 DIAGNOSIS — Z23 Encounter for immunization: Secondary | ICD-10-CM | POA: Diagnosis not present

## 2019-07-08 ENCOUNTER — Other Ambulatory Visit: Payer: Self-pay | Admitting: Family Medicine

## 2019-07-13 ENCOUNTER — Other Ambulatory Visit: Payer: Self-pay | Admitting: Family Medicine

## 2019-07-13 DIAGNOSIS — G4452 New daily persistent headache (NDPH): Secondary | ICD-10-CM
